# Patient Record
Sex: Male | Born: 1965 | Race: White | Hispanic: No | Smoking: Never smoker
Health system: Southern US, Community
[De-identification: ages and names within clinical notes are randomized; demographics above are authoritative.]

## PROBLEM LIST (undated history)

## (undated) DIAGNOSIS — G8929 Other chronic pain: Secondary | ICD-10-CM

## (undated) DIAGNOSIS — B1920 Unspecified viral hepatitis C without hepatic coma: Secondary | ICD-10-CM

## (undated) DIAGNOSIS — I5022 Chronic systolic (congestive) heart failure: Secondary | ICD-10-CM

## (undated) DIAGNOSIS — M542 Cervicalgia: Secondary | ICD-10-CM

## (undated) DIAGNOSIS — R569 Unspecified convulsions: Secondary | ICD-10-CM

## (undated) DIAGNOSIS — M549 Dorsalgia, unspecified: Secondary | ICD-10-CM

## (undated) DIAGNOSIS — K859 Acute pancreatitis without necrosis or infection, unspecified: Secondary | ICD-10-CM

## (undated) DIAGNOSIS — F1010 Alcohol abuse, uncomplicated: Secondary | ICD-10-CM

## (undated) DIAGNOSIS — I10 Essential (primary) hypertension: Secondary | ICD-10-CM

## (undated) DIAGNOSIS — I4891 Unspecified atrial fibrillation: Secondary | ICD-10-CM

## (undated) HISTORY — PX: ABDOMINAL SURGERY: SHX537

## (undated) HISTORY — PX: BACK SURGERY: SHX140

---

## 2003-12-13 ENCOUNTER — Emergency Department (HOSPITAL_COMMUNITY): Admission: EM | Admit: 2003-12-13 | Discharge: 2003-12-13 | Payer: Self-pay | Admitting: Emergency Medicine

## 2005-11-12 ENCOUNTER — Inpatient Hospital Stay (HOSPITAL_COMMUNITY): Admission: RE | Admit: 2005-11-12 | Discharge: 2005-11-13 | Payer: Self-pay | Admitting: Orthopaedic Surgery

## 2006-12-12 ENCOUNTER — Inpatient Hospital Stay (HOSPITAL_COMMUNITY): Admission: EM | Admit: 2006-12-12 | Discharge: 2006-12-16 | Payer: Self-pay | Admitting: *Deleted

## 2009-10-12 ENCOUNTER — Emergency Department (HOSPITAL_COMMUNITY): Admission: EM | Admit: 2009-10-12 | Discharge: 2009-10-12 | Payer: Self-pay | Admitting: Emergency Medicine

## 2010-03-03 ENCOUNTER — Emergency Department (HOSPITAL_COMMUNITY)
Admission: EM | Admit: 2010-03-03 | Discharge: 2010-03-03 | Payer: Self-pay | Source: Home / Self Care | Admitting: Emergency Medicine

## 2010-06-14 LAB — CBC
Hemoglobin: 13.2 g/dL (ref 13.0–17.0)
MCH: 32.2 pg (ref 26.0–34.0)
MCHC: 34.8 g/dL (ref 30.0–36.0)
Platelets: 266 10*3/uL (ref 150–400)
RBC: 4.1 MIL/uL — ABNORMAL LOW (ref 4.22–5.81)

## 2010-06-14 LAB — BASIC METABOLIC PANEL
Calcium: 9.3 mg/dL (ref 8.4–10.5)
Creatinine, Ser: 0.78 mg/dL (ref 0.4–1.5)
GFR calc Af Amer: >60 mL/min (ref >60)
GFR calc non Af Amer: >60 mL/min (ref >60)
Glucose, Bld: 114 mg/dL — ABNORMAL HIGH (ref 70–99)
Sodium: 135 mEq/L (ref 135–145)

## 2010-06-14 LAB — DIFFERENTIAL
Basophils Absolute: 0 10*3/uL (ref 0.0–0.1)
Basophils Relative: 0 % (ref 0–1)
Eosinophils Absolute: 0.2 10*3/uL (ref 0.0–0.7)
Monocytes Relative: 4 % (ref 3–12)
Neutro Abs: 6.1 10*3/uL (ref 1.7–7.7)
Neutrophils Relative %: 84 % — ABNORMAL HIGH (ref 43–77)

## 2010-08-12 NOTE — Group Therapy Note (Signed)
NAME:  Seth Newton, Seth Newton                 ACCOUNT NO.:  [id number]   MEDICAL RECORD NO.:  [id number]          PATIENT TYPE:  INP   LOCATION:  A327                          FACILITY:  [facility]   PHYSICIAN:  Osvaldo Shipper, MD     DATE OF BIRTH:  08-29-1965   DATE OF PROCEDURE:  12/14/2006  DATE OF DISCHARGE:                                 PROGRESS NOTE   Subjectively, the patient is feeling better.  He thinks his abdominal,  actually swelling, is better.  Inflammation looks better according to  the patient.  The pain is also improved.   OBJECTIVE FINDINGS:  He continues to be afebrile.  His other vital signs  are all stable.   LUNGS:  Clear to auscultation.  ABDOMINAL WALL EXAMINATION:  Reveals decreased erythema, decreased  swelling, decreased tenderness.  The area of induration also appears to  be reduced compared to last 2 days.   LABORATORY DATA:  His CBC continues to be stable.  White count is  normal.  Renal function is normal as well.   ASSESSMENT AND PLAN:  1. Abdominal wall cellulitis, improving.  Continue Vancomycin and      Levaquin for now.  Because there is no drainage from the site      cultures could not be sent actually.  Blood cultures were sent and      they have been negative.  So we will basically await the lesion to      improve further.  I think he needs at least 1-2 more days of IV      antibiotics and then he probably can be switched over just to some      p.o. Levaquin on discharge.   The patient has chronic back pain for which he is on OxyContin.  He is  requesting change to the schedule to what he was doing at home and we  have written an order for this as well.   Otherwise, he is tolerating p.o. quite well.  He is ambulating within  the room.  He is progressing well towards discharge.   Plan on discharge within the next couple of days.      Osvaldo Shipper, MD  Electronically Signed     GK/MEDQ  D:  12/14/2006  T:  12/14/2006  Job:  [id number]

## 2010-08-12 NOTE — H&P (Signed)
NAME:  Seth Newton, Seth Newton                 ACCOUNT NO.:  [id number]   MEDICAL RECORD NO.:  [id number]          PATIENT TYPE:  INP   LOCATION:  A327                          FACILITY:  [facility]   PHYSICIAN:  Marcello Moores, MD   DATE OF BIRTH:  [birth year]   DATE OF ADMISSION:  12/11/2006  DATE OF DISCHARGE:  LH                              HISTORY & PHYSICAL   PRIMARY CARE PHYSICIAN:  He follows with Dr. Para March for his blood  pressure.   CHIEF COMPLAINT:  Pain and swelling on his anterior lower abdomen of 2  weeks' duration.   HISTORY OF PRESENT ILLNESS:  Seth Newton is a 45 year old man with no  significant past medical history except that he has cervical spine  injury on pain medication and history of gastric surgery for bleeding  related to ulcer.  He came with pain and swelling of his lower abdomen  area.  He stated that he started to have this problem for the last 2  weeks and it was worsening other than resolving, and he decided to come  to the emergency room.  The patient stated that he did not remember any  insect bite or any other injuries.  He has low-grade fever, but he  denied any vomiting, no diarrhea, no abdominal complaints.  No urinary  complaints.  No pulmonary complaints.   REVIEW OF SYSTEMS:  10-point review of system is noncontributory for  this admission.   ALLERGIES:  HE IS ALLERGIC TO PENICILLIN.   SOCIAL HISTORY:  He denied smoking, denied drug abuse, and stated that  he is an occasional drinker.   FAMILY HISTORY:  Noncontributory.   PAST MEDICAL HISTORY:  1. Cervical spine area injury status post surgery.  2. History of peptic ulcer disease status post surgery for bleeding.   HOME MEDICATIONS:  1. Prilosec 20 mg daily.  2. Robaxin, unspecified dose.  3. Oxycodone 40 mg 3 times a day.  4. Hydrocodone/acetaminophen in 10/325 mg p.r.n.   PHYSICAL EXAMINATION:  GENERAL:  The patient is lying in the bed without  any distress.  VITAL SIGNS:  Temperature is 100.4,  blood pressure is 132/83, pulse is  94, respiratory rate is 20, and saturation is 98%.  HEENT:  Has pink conjunctivae.  Nonicteric sclera.  NECK:  Supple.  Has pain on passive movement.  CHEST:  He has good air entry bilateral.  CVS:  S1-S2 regular.  ABDOMEN:  Soft.  Normoactive bowel sounds.  There is a hyperemic,  indurated area about 5 x 6 cm on the lower abdominal wall below the  umbilicus which is tender and warm.  There is no discharge from wound.  EXTREMITIES:  He has no pedal edema.  CNS:  He is alert and well-oriented.   LABORATORY DATA:  White blood cells 11.3, hemoglobin is 13, hematocrit  is 40, platelet count 190.  Chemistry:  Sodium is 134, potassium 3.6,  chloride  98, bicarb 28, glucose 114, and BUN 8, creatinine 0.7, calcium  is 9.2.   CAT scan of the abdomen was done and showed a large area of  inflammatory  change and infiltration with skin thickening seen in the anterior  abdominal wall left of midline up to 8 cm in transverse diameter.  Otherwise, there were no intra-abdominal problems.   ASSESSMENT:  Abdominal wall cellulitis without abscess currently.  Will  admit the patient and put him on antibiotics.  Will put him on  intravenous fluid and will send blood culture.  Will reassess the area  for possible collection and forming of abscess in case he might need  incision and drainage.  Otherwise, the patient is stable and will  continue his home pain medications as it was.      Marcello Moores, MD  Electronically Signed     MT/MEDQ  D:  12/12/2006  T:  12/12/2006  Job:  [id number]

## 2010-08-12 NOTE — Discharge Summary (Signed)
NAME:  Seth Newton, Seth Newton                 ACCOUNT NO.:  [id number]   MEDICAL RECORD NO.:  [id number]          PATIENT TYPE:  INP   LOCATION:  A327                          FACILITY:  [facility]   PHYSICIAN:  Skeet Latch, DO    DATE OF BIRTH:  1965-08-17   DATE OF ADMISSION:  12/11/2006  DATE OF DISCHARGE:  LH                               DISCHARGE SUMMARY   DISCHARGE DIAGNOSES:  1. Abdominal wall cellulitis.  2. Chronic pain.   BRIEF HOSPITAL COURSE:  The patient is a 45 year old Caucasian male with  no significant past medical history other than chronic pain from a  cervical spine injury and a history of gastric surgery who presented  with pain and swelling in his lower abdomen for 2 weeks. The patient  stated that he had the problem for approximately 2 weeks and it began to  worsen and he decided to come to the emergency room for evaluation. The  patient denied any insect bites or injuries to that area but admitted to  having a low grade fever but no nausea, vomiting or diarrhea. Upon being  seen, the patient's  initial labs showed a white count of 11.3,  hemoglobin 13, hematocrit 40, platelet count of 190,000. Sodium 134,  potassium 3.6, chloride 98, bicarb 28, glucose 114, BUN 8 and a  creatinine of 0.7.   The patient presented with a large are of cellulitis in his lower  abdomen area. CT scan of his abdomen and pelvis was performed which  showed; the abdomen showed no acute upper abdominal abnormalities, his  pelvis showed subcutaneous inflammatory process with skin thickening,  anterior abdominal wall and pelvis left of midline compatible with  cellulitis and no acute intrapelvic processes. The patient was placed on  IV antibiotics and blood cultures were sent. Blood cultures so far have  been negative. The patient's  IV antibiotics were subsequently switched  to oral. The patient's  swelling and discomfort in his lower abdominal  area have improved. The patient is stable enough for  discharge at this  time.   His vitals on discharge, temperature is 98.6, pulse 67, respirations 20,  blood pressure 120/72. Sodium 142, potassium 4.0, chloride 104, CO2 is  29, glucose 104, BUN 7, creatinine 0.75. White count 7.6, hemoglobin  13.4, hematocrit 38.0, platelet count is 281.   DISCHARGE MEDICATIONS:  1. Prilosec 20 mg daily.  2. Continue his Robaxin 3 times a day.  3. His OxyContin 40 mg 3 times a day.  4. Hydrocodone/acetaminophen 10/325 as needed.  5. Cymbalta 60 mg daily.  6. Levaquin 750 mg orally daily x10 days.   CONDITION ON DISCHARGE:  Condition at discharge is stable.   DISPOSITION:  The patient will be discharged to home.   INSTRUCTIONS:  The patient is to return to work after seeing his primary  care physician in the next 5 to 7 days.   DIET:  The patient is to maintain a regular diet.   WOUND CARE:  The patient is to keep area clean with soap and water,  change the bandages 2 to 3  times a day.   ACTIVITY:  The patient is to increase his activity slowly. The patient  is instructed to follow up with his primary care physician in the next 5  to 7 days. Return to emergency room if any increase in abdominal pain,  swelling or discomfort.      Skeet Latch, DO  Electronically Signed     SM/MEDQ  D:  12/16/2006  T:  12/16/2006  Job:  [id number]

## 2010-08-15 NOTE — Op Note (Signed)
NAME:  Seth Newton, Seth Newton                 ACCOUNT NO.:  [id number]   MEDICAL RECORD NO.:  [id number]          PATIENT TYPE:  INP   LOCATION:  5039                         FACILITY:  [facility]   PHYSICIAN:  Sharolyn Douglas, M.D.        DATE OF BIRTH:  1965-08-29   DATE OF PROCEDURE:  11/12/2005  DATE OF DISCHARGE:                                 OPERATIVE REPORT   DIAGNOSIS:  Cervical spondylotic myeloradiculopathy.   PROCEDURE:  1. Anterior cervical diskectomy C4-5, C5-6 and C6-7 with wide      decompression of the spinal cord and nerve root bilaterally.  2. Anterior cervical arthrodesis C4-5, C5-6 and C6-7 with placement of      three allograft prosthesis spacers packed with local autogenous bone      graft.  3. Anterior cervical plating C4-C7 using the Abbott spine system.   SURGEON:  Sharolyn Douglas, MD   ASSISTANT:  Verlin Fester, PA   ANESTHESIA:  General endotracheal.   ESTIMATED BLOOD LOSS:  Minimal.   COMPLICATIONS:  None.   NEEDLE AND SPONGE COUNT:  Correct.   INDICATIONS:  The patient is a pleasant 45 year old male with chronic neck  and upper extremity pain.  He also complains of pain in his right leg.  He  has failed all attempts at conservative treatment modalities.  He has been  taking significant amounts of narcotics which were weaned down in  preparation for surgery.  His imaging studies demonstrate severe spondylosis  and degenerative changes at C4-5, C5-6 at C6-7 with varying degrees of  spinal stenosis and foraminal narrowing.  At this point he elects to undergo  ACDF C4-C7 in hopes of improving his symptoms.  Risk and benefits were  reviewed.  He elected to proceed.   PROCEDURE:  After proper identification in the holding area and informed  consent he was taken to the operating room.  He underwent general  endotracheal anesthesia without difficulty, was given prophylactic IV  antibiotics.  Neuro monitoring was established in the form of motor evoked  potentials and SSEPs.   There he was carefully positioned supine with his  neck in a neutral position.  5 pounds of halter traction applied.  Neck  prepped, draped usual sterile fashion.  The transverse incision was made  just above the cricoid cartilage on the left side.  Dissection was carried  sharply through platysma.  The interval between the SCM and strap muscles  medially was developed down to the prevertebral space.  The esophagus,  trachea, carotid sheath were identified and protected at all times.  There  osteophytes easily palpable at C4-5, C5-6 and C6-7.  Spinal needle was  placed at C5-6.  X-ray taken to confirm this level.  We then elevated the  longus coli muscle out over C4-5, C5-6 and C6-7 disk spaces bilaterally.  Shadow line retractor placed.  The microscope was draped and brought into  the field.  Leksell rongeur used to remove the overhanging osteophytes at C4-  5, C5-6 and C6-7.  We then placed Caspar distraction pins in the C4, C5, C6,  C7 vertebral bodies.  Starting at the 4-5 distraction was applied.  We then  completed the radical diskectomy back to the posterior longitudinal  ligament.  The disk was degenerative and the disk space narrowed.  High-  speed bur used to take down the uncovertebral joints and posterior vertebral  margins.  This bone was saved for later autografting.  2 mm micro Kerrison  punch used to take down the posterior longitudinal ligament, undercut the  posterior vertebral margins and complete wide foraminotomies.  We found that  there was right-sided foraminal stenosis greater than left-sided.  We then  placed a 6 mm allograft prosthesis spacer which was packed with the local  bone graft into the interspace.  Carefully countersunk 1 mm.  We then  performed a similar procedure at C5-6.  Again we found the disk to be  degenerative.  The uncovertebral joints were hypertrophied.  High-speed bur  used to take down the posterior vertebral margins and the uncovertebral   joints.  The micro Kerrison punch was used to take down the posterior  longitudinal ligament and again complete wide foraminotomies.  Again we  placed a 6 mL allograft prosthesis spacer which had been packed with the  local bone graft.  It was countersunk 1 mm.  We then turned our attention to  C6-7.  At this level there was even more degenerative changes and narrowing  of the disk space.  Disk space itself was quite mobile and we could achieve  good distraction.  The diskectomy was taken back to the posterior  longitudinal ligament.  High-speed bur was used take down vertebral margins  and uncovertebral joints.  The Kerrison punch used to complete the  decompression.  We found the subligamentous disk herniation on the right  side which was compressing the cord and extended into the right foramen.  This was all decompressed.  Once we were satisfied with decompression the  cartilaginous endplates were removed and the endplates were prepared for  fusion.  A 7 mm allograft prosthesis spacer was used this time packed with  the local bone graft countersunk 1 mm.  We then placed a 55-mm anterior  cervical plate from Z6-X0 with the 13 mm locking screws.  The screw purchase  was good.  We ensured that the locking mechanism engaged.  The esophagus,  trachea and carotid sheath were examined.  There were no apparent injuries.  The wound was irrigated.  Hemostasis was achieved.  A deep Penrose drain was  left in place.  The platysma was closed with interrupted 2-0 Vicryl suture.  Subcutaneous layer closed with interrupted 3-0 Vicryl suture.  The skin was  approximated with a running 4-0 subcuticular Vicryl suture.  Benzoin, Steri-  Strips placed.  Sterile dressing applied.  Aspen cervical collar placed.  The patient was extubated without difficulty, transferred to recovery stable  in condition able to move his upper and lower extremities.  It should be noted that my assistant [facility], PA was  present throughout the  procedure during the positioning, the exposure, decompression, the  instrumentation, and fusion and also she assisted with wound closure.      Sharolyn Douglas, M.D.  Electronically Signed     MC/MEDQ  D:  11/12/2005  T:  11/13/2005  Job:  [id number]

## 2010-08-15 NOTE — H&P (Signed)
NAME:  Seth Newton, Seth Newton                 ACCOUNT NO.:  [id number]   MEDICAL RECORD NO.:  [id number]          PATIENT TYPE:  INP   LOCATION:  5039                         FACILITY:  [facility]   PHYSICIAN:  Sharolyn Douglas, M.D.        DATE OF BIRTH:  [birth year]   DATE OF ADMISSION:  11/12/2005  DATE OF DISCHARGE:  11/13/2005                                HISTORY & PHYSICAL   Seth Newton COMPLAINT:  Neck and bilateral upper extremity pain, left greater than  right.   HISTORY OF PRESENT ILLNESS:  The patient is a 45 year old male who is having  increasing pain in his neck and his upper extremities.  He was found to have  significant degenerative problems as well as foraminal narrowing and spinal  stenosis at C4 to C7.  He has failed to improve with conservative treatment  and his pain is severe and limiting his activities.  The best course of  management was thought to be an ACDF procedure from C4 to C7.  This  procedure was discussed in detail with the patient by Dr. Noel Gerold, as well as  myself, and including risks and benefits and he opted to proceed.   ALLERGIES:  PENICILLIN causes hives.  MORPHINE causes itching.   MEDICATIONS:  Norco and Robaxin.   PAST MEDICAL HISTORY:  History of ulcers.   PAST SURGICAL HISTORY:  Abdominal surgery in 2006 secondary to his ulcers,  posterior cervical surgery and right arm surgery for an infection.   SOCIAL HISTORY:  The patient denies tobacco use, denies alcohol use.  He is  single, engaged, has 2 children, age 67 and 20.   FAMILY HISTORY:  Noncontributory.   REVIEW OF SYSTEMS:  Negative.   PHYSICAL EXAMINATION:  VITAL SIGNS:  Blood pressure is 110/70, respirations  16 and unlabored.  Pulse is 84 and regular.  GENERAL APPEARANCE:  The patient is a 45 year old white male who is alert  and oriented, in no acute distress.  He is well-nourished an well-groomed  and appears his stated age as well as cooperative to exam.  HEENT:  Head is normocephalic, atraumatic.   Pupils are equal, round and  reactive to light.  Extraocular movements intact.  Nares patent.  Pharynx is  clear.  NECK:  Soft to palpation.  No lymphadenopathy, thyromegaly or bruits  appreciated.  CHEST:  Clear to auscultation bilaterally.  No rales, rhonchi, stridor or  wheezes or friction rubs.  BREASTS:  Not pertinent and not performed.  HEART:  S1 and S2, regular rate and rhythm.  No murmurs, gallops or rubs  noted.  ABDOMEN:  Soft to palpation, nontender and non-distended.  No organomegaly  noted.  GU:  Not pertinent and not performed.  EXTREMITIES:  As per HPI.  SKIN:  Intact without any lesions or rashes.   IMPRESSION:  1. 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  2. Needle phobia.   PLAN:  Admit to [facility] on November 14, 2005 for an ACDF, C4 to  C7, done by Dr. Noel Gerold.  The patient's primary care doctor is Dr. Ree Kida  [facility] in [place], [place].      Verlin Fester, P.A.      Sharolyn Douglas, M.D.  Electronically Signed    CM/MEDQ  D:  11/18/2005  T:  11/18/2005  Job:  [id number]

## 2011-01-08 LAB — CBC
HCT: 36 — ABNORMAL LOW
HCT: 38 — ABNORMAL LOW
Hemoglobin: 12.1 — ABNORMAL LOW
Hemoglobin: 12.7 — ABNORMAL LOW
Hemoglobin: 13.4
MCHC: 34.7
MCHC: 34.7
MCV: 85.9
MCV: 86.1
Platelets: 207
RBC: 4.01 — ABNORMAL LOW
RBC: 4.15 — ABNORMAL LOW
RDW: 13.4
WBC: 6
WBC: 7.1

## 2011-01-08 LAB — COMPREHENSIVE METABOLIC PANEL
ALT: 14
AST: 12
Alkaline Phosphatase: 68
CO2: 28
Calcium: 8.7
Chloride: 100
GFR calc Af Amer: >60
GFR calc non Af Amer: >60
Glucose, Bld: 101 — ABNORMAL HIGH
Potassium: 4.2
Sodium: 133 — ABNORMAL LOW
Total Bilirubin: 0.9

## 2011-01-08 LAB — BASIC METABOLIC PANEL
BUN: 4 — ABNORMAL LOW
CO2: 29
Calcium: 9.1
Calcium: 9.3
Chloride: 104
Chloride: 106
Creatinine, Ser: 0.75
GFR calc Af Amer: >60
GFR calc Af Amer: >60
GFR calc non Af Amer: >60
GFR calc non Af Amer: >60
Glucose, Bld: 104 — ABNORMAL HIGH
Potassium: 4
Potassium: 4
Sodium: 140
Sodium: 142

## 2011-01-08 LAB — VANCOMYCIN, TROUGH: Vancomycin Tr: 9.2

## 2011-01-08 LAB — DIFFERENTIAL
Band Neutrophils: 0
Basophils Absolute: 0.1
Basophils Relative: 0
Basophils Relative: 0
Basophils Relative: 1
Blasts: 0
Eosinophils Absolute: 0.2
Eosinophils Absolute: 0.2
Eosinophils Relative: 3
Eosinophils Relative: 3
Eosinophils Relative: 4
Lymphocytes Relative: 18
Lymphocytes Relative: 18
Lymphs Abs: 0.9
Lymphs Abs: 1
Monocytes Absolute: 0.4
Monocytes Relative: 14 — ABNORMAL HIGH
Neutro Abs: 4.4
Neutrophils Relative %: 73
Neutrophils Relative %: 76

## 2011-01-09 LAB — CBC
MCHC: 33.8
MCV: 87.8
Platelets: 190
WBC: 11.3 — ABNORMAL HIGH

## 2011-01-09 LAB — CULTURE, BLOOD (ROUTINE X 2)

## 2011-01-09 LAB — BASIC METABOLIC PANEL
BUN: 8
CO2: 28
Chloride: 98
Creatinine, Ser: 0.78

## 2011-01-09 LAB — DIFFERENTIAL
Basophils Absolute: 0
Basophils Relative: 0
Eosinophils Absolute: 0.4
Neutrophils Relative %: 78 — ABNORMAL HIGH

## 2012-09-07 ENCOUNTER — Emergency Department (HOSPITAL_COMMUNITY): Payer: Medicare Other

## 2012-09-07 ENCOUNTER — Encounter (HOSPITAL_COMMUNITY): Payer: Self-pay | Admitting: *Deleted

## 2012-09-07 ENCOUNTER — Observation Stay (HOSPITAL_COMMUNITY)
Admission: EM | Admit: 2012-09-07 | Discharge: 2012-09-08 | Disposition: A | Discharge status: Home / Self Care | Payer: Medicare Other | Attending: Internal Medicine | Admitting: Internal Medicine

## 2012-09-07 DIAGNOSIS — R9431 Abnormal electrocardiogram [ECG] [EKG]: Principal | ICD-10-CM | POA: Insufficient documentation

## 2012-09-07 DIAGNOSIS — G40909 Epilepsy, unspecified, not intractable, without status epilepticus: Secondary | ICD-10-CM | POA: Insufficient documentation

## 2012-09-07 DIAGNOSIS — E876 Hypokalemia: Secondary | ICD-10-CM | POA: Insufficient documentation

## 2012-09-07 DIAGNOSIS — G8929 Other chronic pain: Secondary | ICD-10-CM | POA: Insufficient documentation

## 2012-09-07 DIAGNOSIS — R55 Syncope and collapse: Secondary | ICD-10-CM | POA: Insufficient documentation

## 2012-09-07 DIAGNOSIS — I10 Essential (primary) hypertension: Secondary | ICD-10-CM | POA: Insufficient documentation

## 2012-09-07 HISTORY — DX: Essential (primary) hypertension: I10

## 2012-09-07 HISTORY — DX: Unspecified convulsions: R56.9

## 2012-09-07 HISTORY — DX: Other chronic pain: G89.29

## 2012-09-07 HISTORY — DX: Cervicalgia: M54.2

## 2012-09-07 HISTORY — DX: Dorsalgia, unspecified: M54.9

## 2012-09-07 LAB — BASIC METABOLIC PANEL
BUN: 12 mg/dL (ref 6–23)
CO2: 33 mEq/L — ABNORMAL HIGH (ref 19–32)
Calcium: 9.2 mg/dL (ref 8.4–10.5)
Chloride: 100 mEq/L (ref 96–112)
Creatinine, Ser: 0.82 mg/dL (ref 0.50–1.35)
Glucose, Bld: 139 mg/dL — ABNORMAL HIGH (ref 70–99)

## 2012-09-07 LAB — CBC WITH DIFFERENTIAL/PLATELET
Basophils Absolute: 0 10*3/uL (ref 0.0–0.1)
Eosinophils Relative: 0 % (ref 0–5)
HCT: 39.9 % (ref 39.0–52.0)
Hemoglobin: 13.9 g/dL (ref 13.0–17.0)
Lymphocytes Relative: 12 % (ref 12–46)
Lymphs Abs: 0.8 10*3/uL (ref 0.7–4.0)
MCV: 90.1 fL (ref 78.0–100.0)
Monocytes Absolute: 0.3 10*3/uL (ref 0.1–1.0)
Monocytes Relative: 4 % (ref 3–12)
Neutro Abs: 5.7 10*3/uL (ref 1.7–7.7)
RDW: 12.9 % (ref 11.5–15.5)
WBC: 6.8 10*3/uL (ref 4.0–10.5)

## 2012-09-07 LAB — RAPID URINE DRUG SCREEN, HOSP PERFORMED
Amphetamines: NOT DETECTED
Barbiturates: NOT DETECTED
Benzodiazepines: NOT DETECTED
Tetrahydrocannabinol: NOT DETECTED

## 2012-09-07 LAB — TROPONIN I: Troponin I: <0.3 ng/mL (ref <0.30)

## 2012-09-07 LAB — ETHANOL: Alcohol, Ethyl (B): <11 mg/dL (ref 0–11)

## 2012-09-07 MED ORDER — LOSARTAN POTASSIUM 50 MG PO TABS
50.0000 mg | ORAL_TABLET | Freq: Every day | ORAL | Status: DC
  Administered 2012-09-07 – 2012-09-08 (×2): 50 mg via ORAL
  Filled 2012-09-07 (×2): qty 1

## 2012-09-07 MED ORDER — ACETAMINOPHEN 325 MG PO TABS: 650.0000 mg | ORAL_TABLET | Freq: Four times a day (QID) | ORAL | Status: DC | PRN

## 2012-09-07 MED ORDER — ASPIRIN EC 325 MG PO TBEC
325.0000 mg | DELAYED_RELEASE_TABLET | Freq: Every day | ORAL | Status: DC
  Administered 2012-09-07 – 2012-09-08 (×2): 325 mg via ORAL
  Filled 2012-09-07 (×2): qty 1

## 2012-09-07 MED ORDER — LEVETIRACETAM 500 MG PO TABS
500.0000 mg | ORAL_TABLET | Freq: Two times a day (BID) | ORAL | Status: DC
  Administered 2012-09-07 – 2012-09-08 (×2): 500 mg via ORAL
  Filled 2012-09-07 (×2): qty 1

## 2012-09-07 MED ORDER — SODIUM CHLORIDE 0.9 % IJ SOLN
3.0000 mL | Freq: Two times a day (BID) | INTRAMUSCULAR | Status: DC
  Administered 2012-09-07: 3 mL via INTRAVENOUS

## 2012-09-07 MED ORDER — ENOXAPARIN SODIUM 40 MG/0.4ML ~~LOC~~ SOLN
40.0000 mg | SUBCUTANEOUS | Status: DC
  Administered 2012-09-07: 40 mg via SUBCUTANEOUS
  Filled 2012-09-07: qty 0.4

## 2012-09-07 MED ORDER — ONDANSETRON HCL 4 MG PO TABS: 4.0000 mg | ORAL_TABLET | Freq: Four times a day (QID) | ORAL | Status: DC | PRN

## 2012-09-07 MED ORDER — ONDANSETRON HCL 4 MG/2ML IJ SOLN: 4.0000 mg | Freq: Four times a day (QID) | INTRAMUSCULAR | Status: DC | PRN

## 2012-09-07 MED ORDER — DIAZEPAM 5 MG PO TABS
10.0000 mg | ORAL_TABLET | Freq: Two times a day (BID) | ORAL | Status: DC
  Administered 2012-09-07 – 2012-09-08 (×2): 10 mg via ORAL
  Filled 2012-09-07 (×2): qty 2

## 2012-09-07 MED ORDER — MORPHINE SULFATE ER 100 MG PO TBCR
100.0000 mg | EXTENDED_RELEASE_TABLET | Freq: Two times a day (BID) | ORAL | Status: DC
  Administered 2012-09-07 – 2012-09-08 (×2): 100 mg via ORAL
  Filled 2012-09-07 (×2): qty 1

## 2012-09-07 MED ORDER — FENTANYL CITRATE 0.05 MG/ML IJ SOLN
50.0000 ug | INTRAMUSCULAR | Status: DC | PRN
  Administered 2012-09-07: 50 ug via INTRAVENOUS
  Filled 2012-09-07: qty 2

## 2012-09-07 MED ORDER — MIRTAZAPINE 30 MG PO TABS
30.0000 mg | ORAL_TABLET | Freq: Every day | ORAL | Status: DC
  Administered 2012-09-07: 30 mg via ORAL
  Filled 2012-09-07: qty 1

## 2012-09-07 MED ORDER — POTASSIUM CHLORIDE IN NACL 40-0.9 MEQ/L-% IV SOLN
INTRAVENOUS | Status: DC
  Administered 2012-09-07 – 2012-09-08 (×2): via INTRAVENOUS

## 2012-09-07 MED ORDER — SODIUM CHLORIDE 0.9 % IV SOLN: INTRAVENOUS | Status: DC

## 2012-09-07 MED ORDER — ACETAMINOPHEN 650 MG RE SUPP: 650.0000 mg | Freq: Four times a day (QID) | RECTAL | Status: DC | PRN

## 2012-09-07 MED ORDER — OXYCODONE HCL 5 MG PO TABS
30.0000 mg | ORAL_TABLET | Freq: Every day | ORAL | Status: DC
  Administered 2012-09-07 – 2012-09-08 (×4): 30 mg via ORAL
  Filled 2012-09-07 (×4): qty 6

## 2012-09-07 NOTE — H&P (Signed)
[facility] History and Physical  Seth Newton:[id number] DOB: [birth year] DOA: 09/07/2012  Referring physician: Dr. Clarene Duke, emergency room physician PCP: No primary provider on file.  Specialists:   Chief Complaint: Syncope  HPI: Seth Newton is a 47 y.o. male with no known prior cardiac history who presents to the emergency room after a syncopal episode. Patient reports that he was in his usual state of health today. He had gotten up to use the bathroom. He was passing urine when he started to feel somewhat lightheaded. He tried to go back to his chair to sit down and before he needed to his chair he passed out. He does not remember actually falling but woke up on the floor. He feels that he did bump his head. After he woke up, he was mildly disoriented but quickly returned to his baseline mental status. He has not had any focal weakness since then. Did not have any tongue biting. No jerking was reported. Denies any chest pain. He does admit to noticing some irregular heartbeats in the morning of admission. Denies any shortness of breath but did admit to having diaphoresis. He felt that he may have had a fever recently. Also has had some intermittent dysuria. Denies any recent vomiting or diarrhea. The patient has a history of seizures. Last seizure was a few weeks ago.  Review of Systems:   Past Medical History  Diagnosis Date  . Seizures   . Chronic back pain   . Chronic neck pain   . Hypertension    Past Surgical History  Procedure Laterality Date  . Back surgery    . Abdominal surgery     Social History:  reports that he has never smoked. He does not have any smokeless tobacco history on file. He reports that he does not drink alcohol or use illicit drugs.   Allergies  Allergen Reactions  . Gabapentin   . Penicillins Rash    Family history: No family history of premature coronary disease  Prior to Admission medications   Medication Sig Start Date End Date  Taking? Authorizing Provider  diazepam (VALIUM) 10 MG tablet Take 10 mg by mouth 2 (two) times daily.   Yes Historical Provider, MD  levETIRAcetam (KEPPRA) 500 MG tablet Take 500 mg by mouth 2 (two) times daily.   Yes Historical Provider, MD  losartan (COZAAR) 50 MG tablet Take 50 mg by mouth daily.   Yes Historical Provider, MD  mirtazapine (REMERON) 30 MG tablet Take 30 mg by mouth at bedtime.   Yes Historical Provider, MD  oxycodone (ROXICODONE) 30 MG immediate release tablet Take 30 mg by mouth 5 (five) times daily.   Yes Historical Provider, MD  oxymorphone (OPANA ER) 40 MG 12 hr tablet Take 40 mg by mouth every 12 (twelve) hours.   Yes Historical Provider, MD   Physical Exam: Filed Vitals:   09/07/12 1555 09/07/12 1556 09/07/12 1558 09/07/12 1805  BP: 141/87 139/82 139/96 132/85  Pulse: 83 79 106 79  Temp:      TempSrc:      Resp:    18  Height:      Weight:      SpO2:    100%     General:  No acute distress  Eyes: Pupils are equal round reactive to light  ENT: Mucous membranes are moist  Neck: Supple  Cardiovascular: S1, S2, regular rate and rhythm  Respiratory: Clear to auscultation bilaterally   Abdomen: Soft, mildly tender in the  periumbilical region, bowel sounds are active  Skin: No rashes  Musculoskeletal: No pedal edema bilaterally  Psychiatric: Normal affect, cooperative with exam  Neurologic: Grossly intact, nonfocal  Labs on Admission:  Basic Metabolic Panel:  Recent Labs Lab 09/07/12 1540  NA 141  K 3.3*  CL 100  CO2 33*  GLUCOSE 139*  BUN 12  CREATININE 0.82  CALCIUM 9.2   Liver Function Tests: No results found for this basename: AST, ALT, ALKPHOS, BILITOT, PROT, ALBUMIN,  in the last 168 hours No results found for this basename: LIPASE, AMYLASE,  in the last 168 hours No results found for this basename: AMMONIA,  in the last 168 hours CBC:  Recent Labs Lab 09/07/12 1540  WBC 6.8  NEUTROABS 5.7  HGB 13.9  HCT 39.9  MCV 90.1   PLT 234   Cardiac Enzymes:  Recent Labs Lab 09/07/12 1540  TROPONINI <0.30    BNP (last 3 results) No results found for this basename: PROBNP,  in the last 8760 hours CBG: No results found for this basename: GLUCAP,  in the last 168 hours  Radiological Exams on Admission: Dg Chest 1 View  09/07/2012   *RADIOLOGY REPORT*  Clinical Data: Weakness and dizziness.  CHEST - 1 VIEW  Comparison: One-view chest 08/12/2012  Findings: The heart size is normal.  The lungs are clear.  The right hemidiaphragm is elevated.  Slight rightward curvature is noted in the upper thoracic spine.  IMPRESSION: No acute cardiopulmonary disease or significant interval change.   Original Report Authenticated By: Marin Roberts, M.D.   Ct Head Wo Contrast  09/07/2012   *RADIOLOGY REPORT*  Clinical Data: Dizziness, fall.  Posterior head and neck pain.  CT HEAD WITHOUT CONTRAST  Technique:  Contiguous axial images were obtained from the base of the skull through the vertex without contrast.  Comparison: 09/03/2012.  Findings: No evidence of an acute infarct, acute hemorrhage, mass lesion, mass effect or hydrocephalus.  Visualized portions of the paranasal sinuses and mastoid air cells are clear.  No fracture.  IMPRESSION: Negative.   Original Report Authenticated By: Leanna Battles, M.D.    EKG: Independently reviewed. Diffuse T wave inversions in anterolateral and inferiorly leads  Assessment/Plan Active Problems:   Abnormal EKG   Chronic pain   Syncope   Seizure disorder   Hypokalemia   Essential hypertension, benign   1. Syncope. Possibly vasovagal related to micturition. Will check orthostatics. Give IV fluids. The patient was found to have an abnormal EKG. She does not have any known prior cardiac history and has not had any prior cardiac testing. We'll cycle cardiac markers. Repeat EKG in the morning and check 2-D echocardiogram. We will also request a cardiology consultation to see if any further  cardiac testing is needed. Check urinalysis. 2. Seizure disorder. Continue Keppra. Patient will benefit from an outpatient evaluation with a neurologist. 3. Hypokalemia. Replace with IV fluids 4. Hypertension. Continue outpatient management 5. Chronic pain. Continue outpatient regimen  Code Status: Full code Family Communication: Discussed with patient Disposition Plan: Probable discharge tomorrow if improved  Time spent:  Riverlyn Kizziah [facility] Pager [phone]  If 7PM-7AM, please contact night-coverage [email or web address] Password [email or web address] 09/07/2012, 8:20 PM

## 2012-09-07 NOTE — ED Notes (Signed)
Pt states he was standing to pee this morning at home and became very weak. States he went to [facility] and left AMA because they told him he had PCP in his system. States he does not take drugs and he wants somebody to find out what is wrong with him because he is scared he is going to die.

## 2012-09-07 NOTE — ED Provider Notes (Signed)
History     CSN: [id number]  Arrival date & time 09/07/12  1355   First MD Initiated Contact with Patient 09/07/12 1442      Chief Complaint  Patient presents with  . Seizures  . Fatigue     HPI Pt was seen at 1450.  Per pt, c/o gradual onset and worsening of persistent generalized fatigue and weakness for the past several weeks, worse this morning. Pt states he was performing his morning routine in the bathroom today when he suddenly felt "lightheaded." States he "felt like" he was going to pass out; unsure if he did have a brief LOC.  Has been associated with nausea. Pt endorses he has been taking his keppra regularly for his hx seizures, with LD this morning when he woke up. Pt states he does not believe he had a seizure this morning because he was not incont of bowel or bladder, per his usual seizure pattern. Denies CP/palpitations, no SOB/cough, no abd pain, no vomiting/diarrhea, no focal motor weakness, no tingling/numbness in extremities. Pt states he was evaluated at [facility] ED PTA, and left AMA "because they told me I had PCP in my system and I don't do drugs."      Past Medical History  Diagnosis Date  . Seizures   . Chronic back pain   . Chronic neck pain   . Hypertension     Past Surgical History  Procedure Laterality Date  . Back surgery    . Abdominal surgery       History  Substance Use Topics  . Smoking status: Never Smoker   . Smokeless tobacco: Not on file  . Alcohol Use: No      Review of Systems ROS: Statement: All systems negative except as marked or noted in the HPI; Constitutional: Negative for fever and chills. +generalized weakness/fatigue ; ; Eyes: Negative for eye pain, redness and discharge. ; ; ENMT: Negative for ear pain, hoarseness, nasal congestion, sinus pressure and sore throat. ; ; Cardiovascular: Negative for chest pain, palpitations, diaphoresis, dyspnea and peripheral edema. ; ; Respiratory: Negative for cough, wheezing and stridor.  ; ; Gastrointestinal: +nausea. Negative for vomiting, diarrhea, abdominal pain, blood in stool, hematemesis, jaundice and rectal bleeding. . ; ; Genitourinary: Negative for dysuria, flank pain and hematuria. ; ; Musculoskeletal: Negative for back pain and neck pain. Negative for swelling and trauma.; ; Skin: Negative for pruritus, rash, abrasions, blisters, bruising and skin lesion.; ; Neuro: +near syncope. Negative for headache and neck stiffness. Negative for altered level of consciousness , altered mental status, extremity weakness, paresthesias, involuntary movement.       Allergies  Gabapentin and Penicillins  Home Medications   Current Outpatient Rx  Name  Route  Sig  Dispense  Refill  . diazepam (VALIUM) 10 MG tablet   Oral   Take 10 mg by mouth 2 (two) times daily.         Marland Kitchen levETIRAcetam (KEPPRA) 500 MG tablet   Oral   Take 500 mg by mouth 2 (two) times daily.         Marland Kitchen losartan (COZAAR) 50 MG tablet   Oral   Take 50 mg by mouth daily.         . mirtazapine (REMERON) 30 MG tablet   Oral   Take 30 mg by mouth at bedtime.         Marland Kitchen oxycodone (ROXICODONE) 30 MG immediate release tablet   Oral   Take 30 mg by mouth  5 (five) times daily.         Marland Kitchen oxymorphone (OPANA ER) 40 MG 12 hr tablet   Oral   Take 40 mg by mouth every 12 (twelve) hours.           BP 139/96  Pulse 106  Temp(Src) 98.1 F (36.7 C) (Oral)  Resp 20  Ht 6\' 1"  (1.854 m)  Wt 190 lb (86.183 kg)  BMI 25.07 kg/m2  SpO2 100%  Physical Exam 1455: Physical examination:  Nursing notes reviewed; Vital signs and O2 SAT reviewed;  Constitutional: Well developed, Well nourished, Well hydrated, In no acute distress; Head:  Normocephalic, atraumatic; Eyes: EOMI, PERRL, No scleral icterus; ENMT: Mouth and pharynx normal, Mucous membranes moist; Neck: Supple, Full range of motion, No lymphadenopathy; Cardiovascular: Regular rate and rhythm, No gallop; Respiratory: Breath sounds clear & equal  bilaterally, No rales, rhonchi, wheezes.  Speaking full sentences with ease, Normal respiratory effort/excursion; Chest: Nontender, Movement normal; Abdomen: Soft, Nontender, Nondistended, Normal bowel sounds; Genitourinary: No CVA tenderness; Spine:  No midline CS, TS, LS tenderness.;; Extremities: Pulses normal, No tenderness, No edema, No calf edema or asymmetry.; Neuro: AA&Ox3, vague historian. Major CN grossly intact. No facial droop. Speech clear. No gross focal motor or sensory deficits in extremities.; Skin: Color normal, Warm, Dry.   ED Course  Procedures     MDM  MDM Reviewed: previous chart, nursing note and vitals Reviewed previous: labs, ECG, x-ray and CT scan Interpretation: labs, ECG, x-ray and CT scan      Date: 09/07/2012  Rate: 93  Rhythm: normal sinus rhythm  QRS Axis: normal  Intervals: normal  ST/T Wave abnormalities: nonspecific ST/T changes leads V3-V6, II, III, aVF  Conduction Disutrbances:none  Narrative Interpretation:   Old EKG Reviewed: changes noted; new STTW changes compared to previous EKG completed today at OSH at 12:09pm.  Results for orders placed during the hospital encounter of 09/07/12  CBC WITH DIFFERENTIAL      Result Value Range   WBC 6.8  4.0 - 10.5 K/uL   RBC 4.43  4.22 - 5.81 MIL/uL   Hemoglobin 13.9  13.0 - 17.0 g/dL   HCT 16.1  09.6 - 04.5 %   MCV 90.1  78.0 - 100.0 fL   MCH 31.4  26.0 - 34.0 pg   MCHC 34.8  30.0 - 36.0 g/dL   RDW 40.9  81.1 - 91.4 %   Platelets 234  150 - 400 K/uL   Neutrophils Relative % 83 (*) 43 - 77 %   Neutro Abs 5.7  1.7 - 7.7 K/uL   Lymphocytes Relative 12  12 - 46 %   Lymphs Abs 0.8  0.7 - 4.0 K/uL   Monocytes Relative 4  3 - 12 %   Monocytes Absolute 0.3  0.1 - 1.0 K/uL   Eosinophils Relative 0  0 - 5 %   Eosinophils Absolute 0.0  0.0 - 0.7 K/uL   Basophils Relative 0  0 - 1 %   Basophils Absolute 0.0  0.0 - 0.1 K/uL  BASIC METABOLIC PANEL      Result Value Range   Sodium 141  135 - 145 mEq/L    Potassium 3.3 (*) 3.5 - 5.1 mEq/L   Chloride 100  96 - 112 mEq/L   CO2 33 (*) 19 - 32 mEq/L   Glucose, Bld 139 (*) 70 - 99 mg/dL   BUN 12  6 - 23 mg/dL   Creatinine, Ser 7.82  0.50 - 1.35  mg/dL   Calcium 9.2  8.4 - 16.1 mg/dL   GFR calc non Af Amer >90  >90 mL/min   GFR calc Af Amer >90  >90 mL/min  ETHANOL      Result Value Range   Alcohol, Ethyl (B) <11  0 - 11 mg/dL  URINE RAPID DRUG SCREEN (HOSP PERFORMED)      Result Value Range   Opiates NONE DETECTED  NONE DETECTED   Cocaine NONE DETECTED  NONE DETECTED   Benzodiazepines NONE DETECTED  NONE DETECTED   Amphetamines NONE DETECTED  NONE DETECTED   Tetrahydrocannabinol NONE DETECTED  NONE DETECTED   Barbiturates NONE DETECTED  NONE DETECTED  TROPONIN I      Result Value Range   Troponin I <0.30  <0.30 ng/mL   Dg Chest 1 View 09/07/2012   *RADIOLOGY REPORT*  Clinical Data: Weakness and dizziness.  CHEST - 1 VIEW  Comparison: One-view chest 08/12/2012  Findings: The heart size is normal.  The lungs are clear.  The right hemidiaphragm is elevated.  Slight rightward curvature is noted in the upper thoracic spine.  IMPRESSION: No acute cardiopulmonary disease or significant interval change.   Original Report Authenticated By: Marin Roberts, M.D.   Ct Head Wo Contrast 09/07/2012   *RADIOLOGY REPORT*  Clinical Data: Dizziness, fall.  Posterior head and neck pain.  CT HEAD WITHOUT CONTRAST  Technique:  Contiguous axial images were obtained from the base of the skull through the vertex without contrast.  Comparison: 09/03/2012.  Findings: No evidence of an acute infarct, acute hemorrhage, mass lesion, mass effect or hydrocephalus.  Visualized portions of the paranasal sinuses and mastoid air cells are clear.  No fracture.  IMPRESSION: Negative.   Original Report Authenticated By: Leanna Battles, M.D.    [phone]:  [facility] ED records received from 6/7 and 6/11 ED visits. EKG compared and is different now. Will need observation admit.  Continues to deny specific CP or SOB. Neuro exam remains intact. T/C to [facility] Dr. Kerry Hough, case discussed, including:  HPI, pertinent PM/SHx, VS/PE, dx testing, ED course and treatment:  Agreeable to observation admit, requests to write temporary orders, obtain tele bed to team 2.          Laray Anger, DO 09/10/12 [phone]

## 2012-09-07 NOTE — ED Notes (Signed)
Pt states he had an "episode" this morning, states it could have been a seizure, states hx of seizures and takes Keppra. States he hit his head and is complaining of neck and shoulder pain. States EMS took him to [place] and EDP told pt that he has been doing PCP which pt denies. Pt left [facility] AMA.

## 2012-09-07 NOTE — ED Notes (Signed)
Visit from [place] being obtained.

## 2012-09-07 NOTE — ED Notes (Signed)
Pt complain of pain in left shoulder and neck. States he always has pain due to disk problems but the pain is worse today due to ? FALL. EDP aware

## 2012-09-08 ENCOUNTER — Encounter (HOSPITAL_COMMUNITY): Payer: Self-pay | Admitting: Cardiology

## 2012-09-08 DIAGNOSIS — G40909 Epilepsy, unspecified, not intractable, without status epilepticus: Secondary | ICD-10-CM

## 2012-09-08 DIAGNOSIS — R55 Syncope and collapse: Secondary | ICD-10-CM

## 2012-09-08 DIAGNOSIS — I517 Cardiomegaly: Secondary | ICD-10-CM

## 2012-09-08 LAB — URINALYSIS, ROUTINE W REFLEX MICROSCOPIC
Bilirubin Urine: NEGATIVE
Glucose, UA: NEGATIVE mg/dL
Leukocytes, UA: NEGATIVE
Nitrite: NEGATIVE
Specific Gravity, Urine: 1.025 (ref 1.005–1.030)
pH: 6 (ref 5.0–8.0)

## 2012-09-08 LAB — TROPONIN I: Troponin I: <0.3 ng/mL (ref <0.30)

## 2012-09-08 LAB — BASIC METABOLIC PANEL
Chloride: 100 mEq/L (ref 96–112)
GFR calc Af Amer: >90 mL/min (ref >90)
Potassium: 3.2 mEq/L — ABNORMAL LOW (ref 3.5–5.1)

## 2012-09-08 LAB — CBC
HCT: 37.2 % — ABNORMAL LOW (ref 39.0–52.0)
Hemoglobin: 13.1 g/dL (ref 13.0–17.0)
WBC: 6.2 10*3/uL (ref 4.0–10.5)

## 2012-09-08 NOTE — Progress Notes (Signed)
*  PRELIMINARY RESULTS* Echocardiogram 2D Echocardiogram has been performed.  Seth Newton 09/08/2012, 10:45 AM

## 2012-09-08 NOTE — Consult Note (Signed)
Primary cardiologist: Dr. Sherril Croon [facility]) Consulting cardiologist: Dr. Diona Browner  Clinical Summary Seth Newton is a 47 y.o.male with a reported history of seizures, to be establishing with Dr. Gerilyn Pilgrim soon, also hypertension on medical therapy. He states that he is in between primary care physicians but will be establishing with Dr. Sherril Croon in [place] next week. He is now admitted to the hospital after having an episode of syncope while standing and urinating. He states that he has had similar episodes over the years, although more frequently within the last few weeks. He describes symptoms very consistent with neurocardiogenic syncope.  Does report occasional palpitations, however no sudden onset or prolonged episodes, not associated with syncope. He gets an occasional "stabbing" left-sided chest pain without any clear precipitant, no obvious angina symptoms. He reports no personal history of CAD. His ECG at baseline is abnormal with fairly diffuse ST segment depression and T wave inversions, possibly repolarization abnormalities. He has not undergone any prior cardiovascular testing based on available information.  Main complaint is of chronic neck and back pain. He is on standing analgesics therapies. Most recent medication addition is Remeron. He is on Keppra for his seizures.   Allergies  Allergen Reactions  . Gabapentin   . Penicillins Rash    Medications Scheduled Medications: . sodium chloride   Intravenous STAT  . aspirin EC  325 mg Oral Daily  . diazepam  10 mg Oral BID  . enoxaparin (LOVENOX) injection  40 mg Subcutaneous Q24H  . levETIRAcetam  500 mg Oral BID  . losartan  50 mg Oral Daily  . mirtazapine  30 mg Oral QHS  . morphine  100 mg Oral Q12H  . oxycodone  30 mg Oral 5 X Daily  . sodium chloride  3 mL Intravenous Q12H     Infusions: . 0.9 % NaCl with KCl 40 mEq / L 100 mL/hr at 09/08/12 1610     PRN Medications:  acetaminophen, acetaminophen, ondansetron (ZOFRAN) IV,  ondansetron  Past Medical History  Diagnosis Date  . Seizures   . Chronic back pain   . Chronic neck pain   . Essential hypertension, benign     Past Surgical History  Procedure Laterality Date  . Back surgery    . Abdominal surgery      Family History  Problem Relation Age of Onset  . Hypertension Father     Social History Mr. Bellucci reports that he has never smoked. He does not have any smokeless tobacco history on file. Mr. Marciano reports that he does not drink alcohol.  Review of Systems Chronic knee pain and crepitus. Reports stable appetite. No orthopnea or PND. No obvious claudication. Otherwise negative.  Physical Examination Blood pressure 124/80, pulse 91, temperature 98.1 F (36.7 C), temperature source Oral, resp. rate 18, height 6\' 1"  (1.854 m), weight 182 lb 14.4 oz (82.963 kg), SpO2 97.00%.  Intake/Output Summary (Last 24 hours) at 09/08/12 1104 Last data filed at 09/08/12 0740  Gross per 24 hour  Intake    240 ml  Output    450 ml  Net   -210 ml   Comfortable at rest. HEENT: Conjunctiva and lids normal, oropharynx clear. Neck: Supple, no elevated JVP or carotid bruits, no thyromegaly. Lungs: Clear to auscultation, nonlabored breathing at rest. Thorax: Mild anterior chest soreness, no deformity Cardiac: Regular rate and rhythm, no S3 or significant systolic murmur, no pericardial rub. Abdomen: Soft, nontender, bowel sounds present, no guarding or rebound. Extremities: No pitting edema, distal pulses  2+. Skin: Warm and dry. Musculoskeletal: No kyphosis. Neuropsychiatric: Alert and oriented x3, affect grossly appropriate.  Lab Results Basic Metabolic Panel:  Recent Labs Lab 09/07/12 1540 09/08/12 0211  NA 141 138  K 3.3* 3.2*  CL 100 100  CO2 33* 31  GLUCOSE 139* 168*  BUN 12 12  CREATININE 0.82 0.83  CALCIUM 9.2 8.8   CBC:  Recent Labs Lab 09/07/12 1540 09/08/12 0211  WBC 6.8 6.2  NEUTROABS 5.7  --   HGB 13.9 13.1  HCT 39.9 37.2*    MCV 90.1 90.1  PLT 234 189   Cardiac Enzymes:  Recent Labs Lab 09/07/12 1540 09/07/12 2032 09/08/12 0211 09/08/12 0825  TROPONINI <0.30 <0.30 <0.30 <0.30    Imaging Head CT on 6/11 demonstrated no acute findings. Chest x-ray reports no significant interval change with normal heart size and clear lung fields. Right hemidiaphragm is elevated, old.  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 We discussed the basic physiology of this, also importance of maintaining adequate hydration status, also try to avoid frank syncope by taking a seated or supine position with the early onset of symptoms. Telemetry does not demonstrate any arrhythmias. He reports a long-standing history of intermittent episodes, although more frequent recently. Nose medication is Remeron.   2. Abnormal ECG at baseline, possibly repolarization abnormalities. Does have history of hypertension, no clear history of CAD or cardiomyopathy. He does not report any clear-cut angina, cardiac markers are normal. Echocardiogram is pending at this time.  3. Reported seizure disorder, on Keppra. To be establishing with Dr. Gerilyn Pilgrim. He states that these events are clearly different from his syncopal events.  4. Hypertension, on Cozaar.  5. Chronic pain on analgesics.   Recommendations  Have discussed with patient as outlined. Followup on echocardiogram today. If there are no significant structural abnormalities, we will most likely plan a followup stress test as an outpatient to investigate baseline ECG abnormalities, although doubt that this is necessarily contributing to his syncope. We did discuss the possibility of medical therapy for recurring neurocardiogenic syncope, sometimes low-dose beta blocker or even an SSRI can be effective. At this point would try conservative measures first, maintaining adequate hydration and avoiding events as possible by taking  a seated or supine position. He should also pay attention to consistent triggers. We will plan a followup office visit. Also encouraged him to establish with Dr. Sherril Croon and D. [place] as planned.  Jonelle Sidle, M.D., F.A.C.C.

## 2012-09-08 NOTE — Progress Notes (Signed)
Patient discharged home with instructions given on medications,and follow up visits, patient verbalized understanding.No c/o pain or discomfort noted.Vital signs stable.Accompanied by staff to an awaiting vehicle.

## 2012-09-08 NOTE — Discharge Summary (Signed)
Physician Discharge Summary  Seth Newton WUJ:[id number] DOB: 07-13-1965 DOA: 09/07/2012  PCP: No primary provider on file.  Admit date: 09/07/2012 Discharge date: 09/08/2012  Time spent: 40 minutes  Recommendations for Outpatient Follow-up:  1. Dr. Sherril Croon in 1 week. Evaluation of symptoms.  2. Dr. Gerilyn Pilgrim, call in 1-2 weeks   Discharge Diagnoses:  Active Problems:   Abnormal EKG   Chronic pain   Syncope   Seizure disorder   Hypokalemia   Essential hypertension, benign   Vasovagal syncope   Discharge Condition: stable  Diet recommendation: heart healthy  Filed Weights   09/07/12 1359 09/07/12 2022 09/08/12 0452  Weight: 86.183 kg (190 lb) 81.8 kg (180 lb 5.4 oz) 82.963 kg (182 lb 14.4 oz)    History of present illness:   Seth Newton is a 47 y.o. male with no known prior cardiac history who presented to the emergency room on 09/07/12 after a syncopal episode. Patient reported that he was in his usual state of health and he had gotten up to use the bathroom. He was passing urine when he started to feel somewhat lightheaded. He tried to go back to his chair to sit down and before he reached his chair he passed out. He did not remember actually falling but woke up on the floor. He felt that he did bump his head. After he woke up, he was mildly disoriented but quickly returned to his baseline mental status. He ha no focal weaknesses. Did not have any tongue biting. No jerking was reported. Denied any chest pain. He did admit to noticing some irregular heartbeats  morning of admission. Denied any shortness of breath but did admit to having diaphoresis. He felt that he may have had a fever recently. Also  had some intermittent dysuria. Denied any recent vomiting or diarrhea. The patient has a history of seizures. Last seizure was a few weeks ago.     Hospital Course:  Syncope. Admitted to telemetry. No arrythmia on tele. Patient not orthostatic. Given IV fluids. The patient was found  to have an abnormal EKG with ST and T wave abnormalities. No cardiac hx or previous testing. Troponin neg x3. Repeat EKG unchanged from previous.  2-D echocardiogram with EF 60% and grade 1 diastolic dysfunctin. 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 Discussed low dose BB but decided conservative measures appropriate at this time i.e maintaining hydration and avoiding events as much as possible by taking supine or seated position. Pt will follow up with cardiology. Office will call.   Abnormal ECG: possibly repolarization abnormality per cards. Hx HTN, no clear CAD or cardiomyopathy. Echo with EF 60% and grade 1 diastolic dysfunction. Cards to follow OP stress test.    Seizure disorder. Continue Keppra. Patient reports plans to see Dr. Irven Baltimore.    Hypokalemia. Replaced with IV fluids. Resolved at discharge   Hypertension. Controlled during this hospitalization.   Chronic pain. Stable at baseline during this hospitalization Continue outpatient regimen   Procedures:    Consultations:  cardiology  Discharge Exam: Filed Vitals:   09/07/12 2125 09/07/12 2128 09/08/12 0452 09/08/12 0926  BP: 142/96 149/100 121/86 124/80  Pulse:   71 91  Temp:   98.1 F (36.7 C)   TempSrc:   Oral   Resp:   18 18  Height:      Weight:   82.963 kg (182 lb 14.4 oz)   SpO2:   100% 97%    General: awake alert NAD Cardiovascular: RRR No  MGR No MGR No LE edema Respiratory: normal effort BS clear bilaterally to ausculation. No wheeze no rhonchi  Discharge Instructions     Medication List    TAKE these medications       diazepam 10 MG tablet  Commonly known as:  VALIUM  Take 10 mg by mouth 2 (two) times daily.     levETIRAcetam 500 MG tablet  Commonly known as:  KEPPRA  Take 500 mg by mouth 2 (two) times daily.     losartan 50 MG tablet  Commonly known as:  COZAAR  Take 50 mg by mouth daily.     mirtazapine 30 MG tablet  Commonly known as:  REMERON  Take 30 mg by  mouth at bedtime.     oxycodone 30 MG immediate release tablet  Commonly known as:  ROXICODONE  Take 30 mg by mouth 5 (five) times daily.     oxymorphone 40 MG 12 hr tablet  Commonly known as:  OPANA ER  Take 40 mg by mouth every 12 (twelve) hours.       Allergies  Allergen Reactions  . Gabapentin   . Penicillins Rash       Follow-up Information   Follow up with VYAS,DHRUV B., MD. Schedule an appointment as soon as possible for a visit in 1 week. (evaluation of symptoms. )    Contact information:   [address] [place] [address] [phone]       Follow up with [facility], KOFI, MD. Call in 1 week. (referred for follow up)    Contact information:   [address] [place] [address] [phone]        The results of significant diagnostics from this hospitalization (including imaging, microbiology, ancillary and laboratory) are listed below for reference.    Significant Diagnostic Studies: Dg Chest 1 View  09/07/2012   *RADIOLOGY REPORT*  Clinical Data: Weakness and dizziness.  CHEST - 1 VIEW  Comparison: One-view chest 08/12/2012  Findings: The heart size is normal.  The lungs are clear.  The right hemidiaphragm is elevated.  Slight rightward curvature is noted in the upper thoracic spine.  IMPRESSION: No acute cardiopulmonary disease or significant interval change.   Original Report Authenticated By: Marin Roberts, M.D.   Ct Head Wo Contrast  09/07/2012   *RADIOLOGY REPORT*  Clinical Data: Dizziness, fall.  Posterior head and neck pain.  CT HEAD WITHOUT CONTRAST  Technique:  Contiguous axial images were obtained from the base of the skull through the vertex without contrast.  Comparison: 09/03/2012.  Findings: No evidence of an acute infarct, acute hemorrhage, mass lesion, mass effect or hydrocephalus.  Visualized portions of the paranasal sinuses and mastoid air cells are clear.  No fracture.  IMPRESSION: Negative.   Original Report Authenticated By:  Leanna Battles, M.D.        Labs: Basic Metabolic Panel:  Recent Labs Lab 09/07/12 1540 09/08/12 0211  NA 141 138  K 3.3* 3.2*  CL 100 100  CO2 33* 31  GLUCOSE 139* 168*  BUN 12 12  CREATININE 0.82 0.83  CALCIUM 9.2 8.8       CBC:  Recent Labs Lab 09/07/12 1540 09/08/12 0211  WBC 6.8 6.2  NEUTROABS 5.7  --   HGB 13.9 13.1  HCT 39.9 37.2*  MCV 90.1 90.1  PLT 234 189   Cardiac Enzymes:  Recent Labs Lab 09/07/12 1540 09/07/12 2032 09/08/12 0211 09/08/12 0825  TROPONINI <0.30 <0.30 <0.30 <0.30  SignedGwenyth Bender  [facility] 09/08/2012, 12:44 PM Attending:  Patient seen and examined. Above note reviewed. The patient is medically stable for discharge. He will need followup with Dr. Gerilyn Pilgrim, neurology and also establish with a primary care physician. He may need cardiology followup also in the future. His blood glucose was elevated and this will need to be further investigated as an outpatient.

## 2012-09-08 NOTE — Care Management Note (Unsigned)
    Page 1 of 1   09/08/2012     11:39:38 AM   CARE MANAGEMENT NOTE 09/08/2012  Patient:  Seth Newton, Seth Newton   Account Number:  [id number]  Date Initiated:  09/08/2012  Documentation initiated by:  Rosemary Holms  Subjective/Objective Assessment:   Pt from home. Baseline independent, has walker. Does not have HH and he does not anticipate HH.     Action/Plan:   Anticipated DC Date:  09/08/2012   Anticipated DC Plan:  HOME/SELF CARE      DC Planning Services  CM consult      Choice offered to / List presented to:             Status of service:  In process, will continue to follow Medicare Important Message given?   (If response is "NO", the following Medicare IM given date fields will be blank) Date Medicare IM given:   Date Additional Medicare IM given:    Discharge Disposition:    Per UR Regulation:    If discussed at Long Length of Stay Meetings, dates discussed:    Comments:  09/08/12 Rosemary Holms RN BSN CM

## 2012-09-09 ENCOUNTER — Other Ambulatory Visit: Payer: Self-pay | Admitting: *Deleted

## 2012-09-09 DIAGNOSIS — R55 Syncope and collapse: Secondary | ICD-10-CM

## 2012-09-09 DIAGNOSIS — R9431 Abnormal electrocardiogram [ECG] [EKG]: Secondary | ICD-10-CM

## 2012-09-12 ENCOUNTER — Emergency Department (HOSPITAL_COMMUNITY)
Admission: EM | Admit: 2012-09-12 | Discharge: 2012-09-12 | Disposition: A | Discharge status: Home / Self Care | Payer: Medicare Other | Attending: Emergency Medicine | Admitting: Emergency Medicine

## 2012-09-12 ENCOUNTER — Encounter (HOSPITAL_COMMUNITY): Payer: Self-pay | Admitting: *Deleted

## 2012-09-12 ENCOUNTER — Emergency Department (HOSPITAL_COMMUNITY): Payer: Medicare Other

## 2012-09-12 DIAGNOSIS — Y9289 Other specified places as the place of occurrence of the external cause: Secondary | ICD-10-CM | POA: Insufficient documentation

## 2012-09-12 DIAGNOSIS — S0083XA Contusion of other part of head, initial encounter: Secondary | ICD-10-CM

## 2012-09-12 DIAGNOSIS — Y9301 Activity, walking, marching and hiking: Secondary | ICD-10-CM | POA: Insufficient documentation

## 2012-09-12 DIAGNOSIS — G8929 Other chronic pain: Secondary | ICD-10-CM | POA: Insufficient documentation

## 2012-09-12 DIAGNOSIS — I10 Essential (primary) hypertension: Secondary | ICD-10-CM | POA: Insufficient documentation

## 2012-09-12 DIAGNOSIS — S161XXA Strain of muscle, fascia and tendon at neck level, initial encounter: Secondary | ICD-10-CM

## 2012-09-12 DIAGNOSIS — G40909 Epilepsy, unspecified, not intractable, without status epilepticus: Secondary | ICD-10-CM | POA: Insufficient documentation

## 2012-09-12 DIAGNOSIS — Z79899 Other long term (current) drug therapy: Secondary | ICD-10-CM | POA: Insufficient documentation

## 2012-09-12 DIAGNOSIS — S1093XA Contusion of unspecified part of neck, initial encounter: Secondary | ICD-10-CM | POA: Insufficient documentation

## 2012-09-12 DIAGNOSIS — W19XXXA Unspecified fall, initial encounter: Secondary | ICD-10-CM

## 2012-09-12 DIAGNOSIS — R404 Transient alteration of awareness: Secondary | ICD-10-CM | POA: Insufficient documentation

## 2012-09-12 DIAGNOSIS — R296 Repeated falls: Secondary | ICD-10-CM | POA: Insufficient documentation

## 2012-09-12 DIAGNOSIS — R5381 Other malaise: Secondary | ICD-10-CM | POA: Insufficient documentation

## 2012-09-12 DIAGNOSIS — M549 Dorsalgia, unspecified: Secondary | ICD-10-CM | POA: Insufficient documentation

## 2012-09-12 DIAGNOSIS — M542 Cervicalgia: Secondary | ICD-10-CM | POA: Insufficient documentation

## 2012-09-12 DIAGNOSIS — Z880 Allergy status to penicillin: Secondary | ICD-10-CM | POA: Insufficient documentation

## 2012-09-12 DIAGNOSIS — S139XXA Sprain of joints and ligaments of unspecified parts of neck, initial encounter: Secondary | ICD-10-CM | POA: Insufficient documentation

## 2012-09-12 DIAGNOSIS — S0003XA Contusion of scalp, initial encounter: Secondary | ICD-10-CM | POA: Insufficient documentation

## 2012-09-12 LAB — CBC WITH DIFFERENTIAL/PLATELET
Basophils Relative: 0 % (ref 0–1)
Eosinophils Absolute: 0.1 10*3/uL (ref 0.0–0.7)
Eosinophils Relative: 1 % (ref 0–5)
Lymphs Abs: 0.9 10*3/uL (ref 0.7–4.0)
MCH: 31.7 pg (ref 26.0–34.0)
MCHC: 36 g/dL (ref 30.0–36.0)
MCV: 88.2 fL (ref 78.0–100.0)
Monocytes Relative: 8 % (ref 3–12)
Neutrophils Relative %: 76 % (ref 43–77)
Platelets: 225 10*3/uL (ref 150–400)

## 2012-09-12 LAB — GLUCOSE, CAPILLARY: Glucose-Capillary: 105 mg/dL — ABNORMAL HIGH (ref 70–99)

## 2012-09-12 LAB — BASIC METABOLIC PANEL
BUN: 10 mg/dL (ref 6–23)
CO2: 26 mEq/L (ref 19–32)
Calcium: 9.8 mg/dL (ref 8.4–10.5)
GFR calc non Af Amer: 76 mL/min — ABNORMAL LOW (ref >90)
Glucose, Bld: 114 mg/dL — ABNORMAL HIGH (ref 70–99)

## 2012-09-12 LAB — TROPONIN I: Troponin I: <0.3 ng/mL (ref <0.30)

## 2012-09-12 MED ORDER — ACETAMINOPHEN 500 MG PO TABS
ORAL_TABLET | ORAL | Status: AC
  Administered 2012-09-12: 1000 mg via ORAL
  Filled 2012-09-12: qty 2

## 2012-09-12 MED ORDER — ACETAMINOPHEN 500 MG PO TABS: 1000.0000 mg | ORAL_TABLET | Freq: Once | ORAL | Status: AC

## 2012-09-12 MED ORDER — CYCLOBENZAPRINE HCL 10 MG PO TABS: 10.0000 mg | ORAL_TABLET | Freq: Two times a day (BID) | ORAL | Status: DC | PRN

## 2012-09-12 MED ORDER — HYDROMORPHONE HCL PF 1 MG/ML IJ SOLN
1.0000 mg | Freq: Once | INTRAMUSCULAR | Status: AC
  Administered 2012-09-12: 1 mg via INTRAMUSCULAR
  Filled 2012-09-12: qty 1

## 2012-09-12 MED ORDER — NAPROXEN 500 MG PO TABS: 500.0000 mg | ORAL_TABLET | Freq: Two times a day (BID) | ORAL | Status: DC

## 2012-09-12 NOTE — ED Notes (Signed)
Pt states he is very weak. States he last remembered being in the restroom. Admitted last week here for seizures. Old bruising from last week to left face. Area of dried blood to left forehead.

## 2012-09-12 NOTE — ED Provider Notes (Signed)
History     This chart was scribed for Donnetta Hutching, MD, MD by Smitty Pluck, ED Scribe. The patient was seen in room APA05/APA05 and the patient's care was started at 9:18 AM.   CSN: [id number]  Arrival date & time 09/12/12  0853   Chief Complaint  Patient presents with  . Loss of Consciousness  . Fall    The history is provided by the patient and medical records. No language interpreter was used.   HPI Comments: Seth Newton is a 47 y.o. male with hx of seizures (diagnosed within past year) who presents to the Emergency Department wearing c-collar complaining of fall today. Pt reports that he is "unsure what happened" but he states he walked into bathroom to brush his teeth and turned around then suddenly felt weak. Pt takes Keppra for seizures. Wife reports that pt has been having seizures lately. She states he has episodes of shaking and being unresponsive and he is lethargic afterward. He states he has left lateral neck pain and HA. Pt denies hip pain, back pain, fever, chills, nausea, vomiting, diarrhea, weakness, cough, SOB and any other pain.  PCP is Dr. Sherril Croon   Past Medical History  Diagnosis Date  . Seizures   . Chronic back pain   . Chronic neck pain   . Essential hypertension, benign     Past Surgical History  Procedure Laterality Date  . Back surgery    . Abdominal surgery      Family History  Problem Relation Age of Onset  . Hypertension Father     History  Substance Use Topics  . Smoking status: Never Smoker   . Smokeless tobacco: Not on file  . Alcohol Use: No      Review of Systems 10 Systems reviewed and all are negative for acute change except as noted in the HPI.   Allergies  Gabapentin and Penicillins  Home Medications   Current Outpatient Rx  Name  Route  Sig  Dispense  Refill  . diazepam (VALIUM) 10 MG tablet   Oral   Take 10 mg by mouth 2 (two) times daily.         Marland Kitchen levETIRAcetam (KEPPRA) 500 MG tablet   Oral   Take 500 mg by  mouth 2 (two) times daily.         Marland Kitchen losartan (COZAAR) 50 MG tablet   Oral   Take 50 mg by mouth daily.         . mirtazapine (REMERON) 30 MG tablet   Oral   Take 30 mg by mouth at bedtime.         Marland Kitchen oxycodone (ROXICODONE) 30 MG immediate release tablet   Oral   Take 30 mg by mouth 5 (five) times daily.         Marland Kitchen oxymorphone (OPANA ER) 40 MG 12 hr tablet   Oral   Take 40 mg by mouth every 12 (twelve) hours.           BP 129/97  Pulse 94  Temp(Src) 98.5 F (36.9 C) (Oral)  Resp 18  SpO2 100%  Physical Exam  Nursing note and vitals reviewed. Constitutional: He is oriented to person, place, and time. He appears well-developed and well-nourished.  HENT:  Head: Normocephalic.  Tender over left cheek.   Eyes: Conjunctivae and EOM are normal. Pupils are equal, round, and reactive to light.  Neck: Normal range of motion. Neck supple.  Tenderness to left lateral neck radiating to  medial aspect of left shoulder   Cardiovascular: Normal rate, regular rhythm and normal heart sounds.   Pulmonary/Chest: Effort normal and breath sounds normal.  Abdominal: Soft. Bowel sounds are normal.  Musculoskeletal: Normal range of motion.  Neurological: He is alert and oriented to person, place, and time.  Skin: Skin is warm and dry.  Psychiatric: He has a normal mood and affect.    ED Course  Procedures (including critical care time) DIAGNOSTIC STUDIES: Oxygen Saturation is 100% on room air, normal by my interpretation.    COORDINATION OF CARE: 9:23 AM Discussed ED treatment with pt and pt agrees to CT head, CT maxillofacial and CT c-spine.  Results for orders placed during the hospital encounter of 09/12/12  GLUCOSE, CAPILLARY      Result Value Range   Glucose-Capillary 105 (*) 70 - 99 mg/dL   Comment 1 Documented in Chart     Comment 2 Notify RN    BASIC METABOLIC PANEL      Result Value Range   Sodium 140  135 - 145 mEq/L   Potassium 3.5  3.5 - 5.1 mEq/L   Chloride  104  96 - 112 mEq/L   CO2 26  19 - 32 mEq/L   Glucose, Bld 114 (*) 70 - 99 mg/dL   BUN 10  6 - 23 mg/dL   Creatinine, Ser 1.61  0.50 - 1.35 mg/dL   Calcium 9.8  8.4 - 09.6 mg/dL   GFR calc non Af Amer 76 (*) >90 mL/min   GFR calc Af Amer 88 (*) >90 mL/min  CBC WITH DIFFERENTIAL      Result Value Range   WBC 6.3  4.0 - 10.5 K/uL   RBC 4.73  4.22 - 5.81 MIL/uL   Hemoglobin 15.0  13.0 - 17.0 g/dL   HCT 04.5  40.9 - 81.1 %   MCV 88.2  78.0 - 100.0 fL   MCH 31.7  26.0 - 34.0 pg   MCHC 36.0  30.0 - 36.0 g/dL   RDW 91.4  78.2 - 95.6 %   Platelets 225  150 - 400 K/uL   Neutrophils Relative % 76  43 - 77 %   Neutro Abs 4.8  1.7 - 7.7 K/uL   Lymphocytes Relative 15  12 - 46 %   Lymphs Abs 0.9  0.7 - 4.0 K/uL   Monocytes Relative 8  3 - 12 %   Monocytes Absolute 0.5  0.1 - 1.0 K/uL   Eosinophils Relative 1  0 - 5 %   Eosinophils Absolute 0.1  0.0 - 0.7 K/uL   Basophils Relative 0  0 - 1 %   Basophils Absolute 0.0  0.0 - 0.1 K/uL         Ct Head Wo Contrast  09/12/2012   *RADIOLOGY REPORT*  Clinical Data:  Seizure, loss of consciousness, neck pain, fall, bruises  CT HEAD WITHOUT CONTRAST CT MAXILLOFACIAL WITHOUT CONTRAST CT CERVICAL SPINE WITHOUT CONTRAST  Technique:  Multidetector CT imaging of the head, cervical spine, and maxillofacial structures were performed using the standard protocol without intravenous contrast. Multiplanar CT image reconstructions of the cervical spine and maxillofacial structures were also generated.  Comparison:   Head CT 09/07/2012  CT HEAD  Findings: No intracranial hemorrhage.  No parenchymal contusion. No midline shift or mass effect.  Basilar cisterns are patent. No skull base fracture.  No fluid in the paranasal sinuses or mastoid air cells.  IMPRESSION: No intracranial trauma  CT MAXILLOFACIAL  Findings:  No evidence of fracture of the zygomatic arches. Orbital walls are intact.  Intraconal contents are normal.  Globes are normal.  There is no fluid in the  paranasal sinuses.  No evidence of fracture of the maxilla or pterygoid plates.  The mandibles are located.  No evidence of mandibular fracture.  No abnormality of the deep soft tissues of the face.  IMPRESSION: No evidence of facial fracture  CT CERVICAL SPINE  Findings:   No prevertebral soft tissue swelling.  Normal alignment of cervical vertebral bodies.  No loss of vertebral body height. Normal facet articulation.  Normal craniocervical junction.  No evidence epidural or paraspinal hematoma.  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   Original Report Authenticated By: Genevive Bi, M.D.   Ct Cervical Spine Wo Contrast  09/12/2012   *RADIOLOGY REPORT*  Clinical Data:  Seizure, loss of consciousness, neck pain, fall, bruises  CT HEAD WITHOUT CONTRAST CT MAXILLOFACIAL WITHOUT CONTRAST CT CERVICAL SPINE WITHOUT CONTRAST  Technique:  Multidetector CT imaging of the head, cervical spine, and maxillofacial structures were performed using the standard protocol without intravenous contrast. Multiplanar CT image reconstructions of the cervical spine and maxillofacial structures were also generated.  Comparison:   Head CT 09/07/2012  CT HEAD  Findings: No intracranial hemorrhage.  No parenchymal contusion. No midline shift or mass effect.  Basilar cisterns are patent. No skull base fracture.  No fluid in the paranasal sinuses or mastoid air cells.  IMPRESSION: No intracranial trauma  CT MAXILLOFACIAL  Findings:  No evidence of fracture of the zygomatic arches. Orbital walls are intact.  Intraconal contents are normal.  Globes are normal.  There is no fluid in the paranasal sinuses.  No evidence of fracture of the maxilla or pterygoid plates.  The mandibles are located.  No evidence of mandibular fracture.  No abnormality of the deep soft tissues of the face.  IMPRESSION: No evidence of facial fracture  CT CERVICAL SPINE  Findings:   No  prevertebral soft tissue swelling.  Normal alignment of cervical vertebral bodies.  No loss of vertebral body height. Normal facet articulation.  Normal craniocervical junction.  No evidence epidural or paraspinal hematoma.  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   Original Report Authenticated By: Genevive Bi, M.D.   Ct Maxillofacial Wo Cm  09/12/2012   *RADIOLOGY REPORT*  Clinical Data:  Seizure, loss of consciousness, neck pain, fall, bruises  CT HEAD WITHOUT CONTRAST CT MAXILLOFACIAL WITHOUT CONTRAST CT CERVICAL SPINE WITHOUT CONTRAST  Technique:  Multidetector CT imaging of the head, cervical spine, and maxillofacial structures were performed using the standard protocol without intravenous contrast. Multiplanar CT image reconstructions of the cervical spine and maxillofacial structures were also generated.  Comparison:   Head CT 09/07/2012  CT HEAD  Findings: No intracranial hemorrhage.  No parenchymal contusion. No midline shift or mass effect.  Basilar cisterns are patent. No skull base fracture.  No fluid in the paranasal sinuses or mastoid air cells.  IMPRESSION: No intracranial trauma  CT MAXILLOFACIAL  Findings:  No evidence of fracture of the zygomatic arches. Orbital walls are intact.  Intraconal contents are normal.  Globes are normal.  There is no fluid in the paranasal sinuses.  No evidence of fracture of the maxilla or pterygoid plates.  The mandibles are located.  No evidence of mandibular fracture.  No abnormality of the  deep soft tissues of the face.  IMPRESSION: No evidence of facial fracture  CT CERVICAL SPINE  Findings:   No prevertebral soft tissue swelling.  Normal alignment of cervical vertebral bodies.  No loss of vertebral body height. Normal facet articulation.  Normal craniocervical junction.  No evidence epidural or paraspinal hematoma.  Anterior cervical fusion again noted with interbody spacers  and bony fusion  IMPRESSION: No acute findings cervical spine.   Original Report Authenticated By: Genevive Bi, M.D.     No diagnosis found.   Date: 09/12/2012  Rate: 95  Rhythm: normal sinus rhythm  QRS Axis: normal  Intervals: normal  ST/T Wave abnormalities: T wave inversion inferiorly and laterally  Conduction Disutrbances: none  Narrative Interpretation: unremarkable     MDM  CT scan of head, face, cervical spine are negative. EKG and hemoglobin normal. No neurological deficits. Discharge meds Flexeril 10 mg #20 and Naprosyn 500 mg #20      I personally performed the services described in this documentation, which was scribed in my presence. The recorded information has been reviewed and is accurate.    Donnetta Hutching, MD 09/12/12 1330

## 2012-09-12 NOTE — ED Notes (Signed)
c-collar removed per RN

## 2012-09-15 ENCOUNTER — Encounter (HOSPITAL_COMMUNITY)
Admission: RE | Admit: 2012-09-15 | Discharge: 2012-09-15 | Disposition: A | Discharge status: Home / Self Care | Payer: Medicare Other | Source: Ambulatory Visit | Attending: Cardiology | Admitting: Cardiology

## 2012-09-15 ENCOUNTER — Encounter (HOSPITAL_COMMUNITY): Payer: Self-pay

## 2012-09-15 DIAGNOSIS — R9431 Abnormal electrocardiogram [ECG] [EKG]: Secondary | ICD-10-CM

## 2012-09-15 DIAGNOSIS — R55 Syncope and collapse: Secondary | ICD-10-CM

## 2012-09-15 MED ORDER — TECHNETIUM TC 99M SESTAMIBI - CARDIOLITE
30.0000 | Freq: Once | INTRAVENOUS | Status: AC | PRN
  Administered 2012-09-15: 30 via INTRAVENOUS

## 2012-09-15 MED ORDER — TECHNETIUM TC 99M SESTAMIBI - CARDIOLITE
10.0000 | Freq: Once | INTRAVENOUS | Status: AC | PRN
  Administered 2012-09-15: 10 via INTRAVENOUS

## 2012-09-15 MED ORDER — SODIUM CHLORIDE 0.9 % IJ SOLN
INTRAMUSCULAR | Status: AC
  Administered 2012-09-15: 10 mL via INTRAVENOUS
  Filled 2012-09-15: qty 10

## 2012-09-15 MED ORDER — REGADENOSON 0.4 MG/5ML IV SOLN
INTRAVENOUS | Status: AC
  Administered 2012-09-15: 0.4 mg via INTRAVENOUS
  Filled 2012-09-15: qty 5

## 2012-09-15 NOTE — Progress Notes (Signed)
Stress Lab Nurses Notes - Seth Newton 09/15/2012 Reason for doing test: Chest Pain Type of test: Marlane Hatcher Nurse performing test: Parke Poisson, RN Nuclear Medicine Tech: Lyndel Pleasure Echo Tech: Not Applicable MD performing test: R. Rothbart Family MD: NPCP Test explained and consent signed: yes IV started: 22g jelco, Saline lock flushed, No redness or edema and Saline lock started in radiology Symptoms:  Chest pressure & " Felt heart beating faster" Treatment/Intervention: None Reason test stopped: protocol completed After recovery IV was: Discontinued via X-ray tech and No redness or edema Patient to return to Nuc. Med at : 12:30 Patient discharged: Home Patient's Condition upon discharge was: stable Comments: During test 120/93 & HR 136.  Recovery BP 120/97 & HR 102.  Symptoms resolved in recovery. Seth Newton

## 2012-09-22 ENCOUNTER — Encounter: Payer: Medicare Other | Admitting: Adult Health

## 2012-09-22 NOTE — Progress Notes (Signed)
   Sent to ER due to syncopal episode in waiting room.

## 2013-02-24 ENCOUNTER — Encounter: Payer: Self-pay | Admitting: Cardiovascular Disease

## 2013-02-27 DIAGNOSIS — E871 Hypo-osmolality and hyponatremia: Secondary | ICD-10-CM | POA: Insufficient documentation

## 2013-02-27 DIAGNOSIS — F1020 Alcohol dependence, uncomplicated: Secondary | ICD-10-CM | POA: Insufficient documentation

## 2013-02-27 DIAGNOSIS — K852 Alcohol induced acute pancreatitis without necrosis or infection: Secondary | ICD-10-CM | POA: Insufficient documentation

## 2013-02-27 DIAGNOSIS — J690 Pneumonitis due to inhalation of food and vomit: Secondary | ICD-10-CM | POA: Insufficient documentation

## 2013-03-12 ENCOUNTER — Encounter (HOSPITAL_COMMUNITY): Payer: Self-pay | Admitting: Emergency Medicine

## 2013-03-12 ENCOUNTER — Emergency Department (HOSPITAL_COMMUNITY): Payer: Medicare Other

## 2013-03-12 ENCOUNTER — Inpatient Hospital Stay (HOSPITAL_COMMUNITY)
Admission: EM | Admit: 2013-03-12 | Discharge: 2013-03-19 | DRG: 308 | Disposition: A | Discharge status: Home Health | Payer: Medicare Other | Attending: Internal Medicine | Admitting: Internal Medicine

## 2013-03-12 DIAGNOSIS — G40909 Epilepsy, unspecified, not intractable, without status epilepticus: Secondary | ICD-10-CM | POA: Diagnosis present

## 2013-03-12 DIAGNOSIS — I4891 Unspecified atrial fibrillation: Principal | ICD-10-CM

## 2013-03-12 DIAGNOSIS — I5043 Acute on chronic combined systolic (congestive) and diastolic (congestive) heart failure: Secondary | ICD-10-CM | POA: Diagnosis present

## 2013-03-12 DIAGNOSIS — Z79899 Other long term (current) drug therapy: Secondary | ICD-10-CM

## 2013-03-12 DIAGNOSIS — I5022 Chronic systolic (congestive) heart failure: Secondary | ICD-10-CM

## 2013-03-12 DIAGNOSIS — D539 Nutritional anemia, unspecified: Secondary | ICD-10-CM | POA: Diagnosis present

## 2013-03-12 DIAGNOSIS — Z9119 Patient's noncompliance with other medical treatment and regimen: Secondary | ICD-10-CM

## 2013-03-12 DIAGNOSIS — R9431 Abnormal electrocardiogram [ECG] [EKG]: Secondary | ICD-10-CM

## 2013-03-12 DIAGNOSIS — F1010 Alcohol abuse, uncomplicated: Secondary | ICD-10-CM

## 2013-03-12 DIAGNOSIS — K5900 Constipation, unspecified: Secondary | ICD-10-CM | POA: Diagnosis present

## 2013-03-12 DIAGNOSIS — I5021 Acute systolic (congestive) heart failure: Secondary | ICD-10-CM

## 2013-03-12 DIAGNOSIS — Z7982 Long term (current) use of aspirin: Secondary | ICD-10-CM

## 2013-03-12 DIAGNOSIS — E876 Hypokalemia: Secondary | ICD-10-CM | POA: Diagnosis present

## 2013-03-12 DIAGNOSIS — I959 Hypotension, unspecified: Secondary | ICD-10-CM | POA: Diagnosis present

## 2013-03-12 DIAGNOSIS — I426 Alcoholic cardiomyopathy: Secondary | ICD-10-CM

## 2013-03-12 DIAGNOSIS — R109 Unspecified abdominal pain: Secondary | ICD-10-CM

## 2013-03-12 DIAGNOSIS — Z91199 Patient's noncompliance with other medical treatment and regimen due to unspecified reason: Secondary | ICD-10-CM

## 2013-03-12 DIAGNOSIS — B1920 Unspecified viral hepatitis C without hepatic coma: Secondary | ICD-10-CM | POA: Diagnosis present

## 2013-03-12 DIAGNOSIS — I10 Essential (primary) hypertension: Secondary | ICD-10-CM | POA: Diagnosis present

## 2013-03-12 DIAGNOSIS — G8929 Other chronic pain: Secondary | ICD-10-CM | POA: Diagnosis present

## 2013-03-12 DIAGNOSIS — R7402 Elevation of levels of lactic acid dehydrogenase (LDH): Secondary | ICD-10-CM | POA: Diagnosis present

## 2013-03-12 DIAGNOSIS — F1020 Alcohol dependence, uncomplicated: Secondary | ICD-10-CM | POA: Diagnosis present

## 2013-03-12 DIAGNOSIS — K7460 Unspecified cirrhosis of liver: Secondary | ICD-10-CM

## 2013-03-12 DIAGNOSIS — R55 Syncope and collapse: Secondary | ICD-10-CM

## 2013-03-12 DIAGNOSIS — I428 Other cardiomyopathies: Secondary | ICD-10-CM | POA: Diagnosis present

## 2013-03-12 DIAGNOSIS — Z8249 Family history of ischemic heart disease and other diseases of the circulatory system: Secondary | ICD-10-CM

## 2013-03-12 DIAGNOSIS — R601 Generalized edema: Secondary | ICD-10-CM

## 2013-03-12 DIAGNOSIS — D72819 Decreased white blood cell count, unspecified: Secondary | ICD-10-CM | POA: Diagnosis present

## 2013-03-12 DIAGNOSIS — R7401 Elevation of levels of liver transaminase levels: Secondary | ICD-10-CM

## 2013-03-12 DIAGNOSIS — I509 Heart failure, unspecified: Secondary | ICD-10-CM | POA: Diagnosis present

## 2013-03-12 HISTORY — DX: Chronic systolic (congestive) heart failure: I50.22

## 2013-03-12 HISTORY — DX: Unspecified atrial fibrillation: I48.91

## 2013-03-12 HISTORY — DX: Alcohol abuse, uncomplicated: F10.10

## 2013-03-12 HISTORY — DX: Acute pancreatitis without necrosis or infection, unspecified: K85.90

## 2013-03-12 HISTORY — DX: Unspecified viral hepatitis C without hepatic coma: B19.20

## 2013-03-12 LAB — URINALYSIS, ROUTINE W REFLEX MICROSCOPIC
Hgb urine dipstick: NEGATIVE
Ketones, ur: NEGATIVE mg/dL
Leukocytes, UA: NEGATIVE
Nitrite: NEGATIVE
Protein, ur: NEGATIVE mg/dL
Specific Gravity, Urine: 1.01 (ref 1.005–1.030)
Urobilinogen, UA: 0.2 mg/dL (ref 0.0–1.0)

## 2013-03-12 LAB — COMPREHENSIVE METABOLIC PANEL
Alkaline Phosphatase: 61 U/L (ref 39–117)
BUN: 3 mg/dL — ABNORMAL LOW (ref 6–23)
Chloride: 97 mEq/L (ref 96–112)
Creatinine, Ser: 0.62 mg/dL (ref 0.50–1.35)
GFR calc Af Amer: >90 mL/min (ref >90)
Glucose, Bld: 109 mg/dL — ABNORMAL HIGH (ref 70–99)
Potassium: 3.3 mEq/L — ABNORMAL LOW (ref 3.5–5.1)
Total Bilirubin: 0.5 mg/dL (ref 0.3–1.2)
Total Protein: 7 g/dL (ref 6.0–8.3)

## 2013-03-12 LAB — CBC WITH DIFFERENTIAL/PLATELET
Basophils Relative: 2 % — ABNORMAL HIGH (ref 0–1)
Eosinophils Absolute: 0.1 10*3/uL (ref 0.0–0.7)
HCT: 39.4 % (ref 39.0–52.0)
Hemoglobin: 12.8 g/dL — ABNORMAL LOW (ref 13.0–17.0)
Lymphs Abs: 0.5 10*3/uL — ABNORMAL LOW (ref 0.7–4.0)
MCH: 33.2 pg (ref 26.0–34.0)
MCHC: 32.5 g/dL (ref 30.0–36.0)
Monocytes Absolute: 0.3 10*3/uL (ref 0.1–1.0)
Monocytes Relative: 10 % (ref 3–12)
Neutrophils Relative %: 69 % (ref 43–77)
Platelets: 223 10*3/uL (ref 150–400)
RBC: 3.85 MIL/uL — ABNORMAL LOW (ref 4.22–5.81)

## 2013-03-12 LAB — LIPASE, BLOOD: Lipase: 32 U/L (ref 11–59)

## 2013-03-12 LAB — AMMONIA: Ammonia: 35 umol/L (ref 11–60)

## 2013-03-12 LAB — TROPONIN I: Troponin I: <0.3 ng/mL (ref <0.30)

## 2013-03-12 LAB — LACTIC ACID, PLASMA: Lactic Acid, Venous: 1.1 mmol/L (ref 0.5–2.2)

## 2013-03-12 MED ORDER — MORPHINE SULFATE 4 MG/ML IJ SOLN
4.0000 mg | Freq: Once | INTRAMUSCULAR | Status: AC
  Administered 2013-03-12: 4 mg via INTRAVENOUS
  Filled 2013-03-12: qty 1

## 2013-03-12 MED ORDER — DEXTROSE 5 % IV SOLN
1.0000 g | Freq: Once | INTRAVENOUS | Status: AC
  Administered 2013-03-12: 1 g via INTRAVENOUS
  Filled 2013-03-12: qty 10

## 2013-03-12 MED ORDER — THIAMINE HCL 100 MG/ML IJ SOLN: 100.0000 mg | Freq: Every day | INTRAMUSCULAR | Status: DC

## 2013-03-12 MED ORDER — DILTIAZEM HCL 25 MG/5ML IV SOLN
20.0000 mg | Freq: Once | INTRAVENOUS | Status: AC
  Administered 2013-03-12: 20 mg via INTRAVENOUS
  Filled 2013-03-12: qty 5

## 2013-03-12 MED ORDER — METOPROLOL TARTRATE 25 MG PO TABS
25.0000 mg | ORAL_TABLET | Freq: Two times a day (BID) | ORAL | Status: DC
  Administered 2013-03-12 – 2013-03-13 (×2): 25 mg via ORAL
  Filled 2013-03-12 (×2): qty 1

## 2013-03-12 MED ORDER — SODIUM CHLORIDE 0.9 % IJ SOLN
3.0000 mL | INTRAMUSCULAR | Status: DC | PRN
  Administered 2013-03-13: 3 mL via INTRAVENOUS
  Administered 2013-03-14: 18:00:00 via INTRAVENOUS
  Administered 2013-03-14: 3 mL via INTRAVENOUS

## 2013-03-12 MED ORDER — ONDANSETRON HCL 4 MG/2ML IJ SOLN
4.0000 mg | Freq: Once | INTRAMUSCULAR | Status: AC
  Administered 2013-03-12: 4 mg via INTRAVENOUS
  Filled 2013-03-12: qty 2

## 2013-03-12 MED ORDER — IOHEXOL 300 MG/ML  SOLN
100.0000 mL | Freq: Once | INTRAMUSCULAR | Status: AC | PRN
  Administered 2013-03-12: 100 mL via INTRAVENOUS

## 2013-03-12 MED ORDER — DOCUSATE SODIUM 100 MG PO CAPS
100.0000 mg | ORAL_CAPSULE | Freq: Every day | ORAL | Status: DC
  Administered 2013-03-12 – 2013-03-19 (×4): 100 mg via ORAL
  Filled 2013-03-12 (×6): qty 1

## 2013-03-12 MED ORDER — ASPIRIN EC 81 MG PO TBEC
81.0000 mg | DELAYED_RELEASE_TABLET | Freq: Every day | ORAL | Status: DC
  Administered 2013-03-12 – 2013-03-19 (×8): 81 mg via ORAL
  Filled 2013-03-12 (×8): qty 1

## 2013-03-12 MED ORDER — FOLIC ACID 1 MG PO TABS
1.0000 mg | ORAL_TABLET | Freq: Every day | ORAL | Status: DC
  Administered 2013-03-12 – 2013-03-19 (×8): 1 mg via ORAL
  Filled 2013-03-12 (×8): qty 1

## 2013-03-12 MED ORDER — DILTIAZEM HCL 100 MG IV SOLR
5.0000 mg/h | INTRAVENOUS | Status: DC
  Administered 2013-03-12: 15 mg/h via INTRAVENOUS
  Administered 2013-03-12: 5 mg/h via INTRAVENOUS

## 2013-03-12 MED ORDER — ADULT MULTIVITAMIN W/MINERALS CH
1.0000 | ORAL_TABLET | Freq: Every day | ORAL | Status: DC
  Administered 2013-03-12 – 2013-03-19 (×8): 1 via ORAL
  Filled 2013-03-12 (×8): qty 1

## 2013-03-12 MED ORDER — FUROSEMIDE 10 MG/ML IJ SOLN
40.0000 mg | Freq: Two times a day (BID) | INTRAMUSCULAR | Status: DC
  Administered 2013-03-12 – 2013-03-15 (×6): 40 mg via INTRAVENOUS
  Filled 2013-03-12 (×6): qty 4

## 2013-03-12 MED ORDER — ONDANSETRON HCL 4 MG/2ML IJ SOLN: 4.0000 mg | Freq: Three times a day (TID) | INTRAMUSCULAR | Status: DC | PRN

## 2013-03-12 MED ORDER — DIAZEPAM 5 MG PO TABS: 10.0000 mg | ORAL_TABLET | Freq: Two times a day (BID) | ORAL | Status: DC | PRN

## 2013-03-12 MED ORDER — PANTOPRAZOLE SODIUM 40 MG PO TBEC
40.0000 mg | DELAYED_RELEASE_TABLET | Freq: Every day | ORAL | Status: DC
  Administered 2013-03-12 – 2013-03-19 (×8): 40 mg via ORAL
  Filled 2013-03-12 (×8): qty 1

## 2013-03-12 MED ORDER — DILTIAZEM HCL 25 MG/5ML IV SOLN
10.0000 mg | Freq: Once | INTRAVENOUS | Status: AC
  Administered 2013-03-12: 10 mg via INTRAVENOUS

## 2013-03-12 MED ORDER — DIAZEPAM 5 MG PO TABS
10.0000 mg | ORAL_TABLET | Freq: Two times a day (BID) | ORAL | Status: DC | PRN
  Administered 2013-03-12 – 2013-03-19 (×8): 10 mg via ORAL
  Filled 2013-03-12 (×9): qty 2

## 2013-03-12 MED ORDER — ENOXAPARIN SODIUM 40 MG/0.4ML ~~LOC~~ SOLN
40.0000 mg | SUBCUTANEOUS | Status: DC
  Administered 2013-03-12 – 2013-03-18 (×7): 40 mg via SUBCUTANEOUS
  Filled 2013-03-12 (×7): qty 0.4

## 2013-03-12 MED ORDER — LORAZEPAM 2 MG/ML IJ SOLN
1.0000 mg | Freq: Four times a day (QID) | INTRAMUSCULAR | Status: AC | PRN
  Administered 2013-03-13 – 2013-03-14 (×4): 1 mg via INTRAVENOUS
  Filled 2013-03-12 (×4): qty 1

## 2013-03-12 MED ORDER — SODIUM CHLORIDE 0.9 % IV BOLUS (SEPSIS)
1000.0000 mL | Freq: Once | INTRAVENOUS | Status: AC
  Administered 2013-03-12: 1000 mL via INTRAVENOUS

## 2013-03-12 MED ORDER — DILTIAZEM HCL 100 MG IV SOLR
5.0000 mg/h | INTRAVENOUS | Status: DC
  Administered 2013-03-12: 15 mg/h via INTRAVENOUS
  Filled 2013-03-12: qty 100

## 2013-03-12 MED ORDER — SODIUM CHLORIDE 0.9 % IV SOLN: 250.0000 mL | INTRAVENOUS | Status: DC | PRN

## 2013-03-12 MED ORDER — LORAZEPAM 0.5 MG PO TABS
1.0000 mg | ORAL_TABLET | Freq: Four times a day (QID) | ORAL | Status: AC | PRN
  Administered 2013-03-13 – 2013-03-15 (×6): 1 mg via ORAL
  Filled 2013-03-12 (×7): qty 2

## 2013-03-12 MED ORDER — VITAMIN B-1 100 MG PO TABS
100.0000 mg | ORAL_TABLET | Freq: Every day | ORAL | Status: DC
  Administered 2013-03-12 – 2013-03-19 (×8): 100 mg via ORAL
  Filled 2013-03-12 (×8): qty 1

## 2013-03-12 MED ORDER — ONDANSETRON HCL 4 MG/2ML IJ SOLN
4.0000 mg | Freq: Four times a day (QID) | INTRAMUSCULAR | Status: DC | PRN
  Administered 2013-03-13 – 2013-03-17 (×3): 4 mg via INTRAVENOUS
  Filled 2013-03-12 (×3): qty 2

## 2013-03-12 MED ORDER — LISINOPRIL 5 MG PO TABS
2.5000 mg | ORAL_TABLET | Freq: Every day | ORAL | Status: DC
  Administered 2013-03-12 – 2013-03-18 (×7): 2.5 mg via ORAL
  Filled 2013-03-12 (×7): qty 1

## 2013-03-12 MED ORDER — IOHEXOL 300 MG/ML  SOLN
50.0000 mL | Freq: Once | INTRAMUSCULAR | Status: AC | PRN
  Administered 2013-03-12: 50 mL via ORAL

## 2013-03-12 MED ORDER — ACETAMINOPHEN 325 MG PO TABS
650.0000 mg | ORAL_TABLET | ORAL | Status: DC | PRN
  Administered 2013-03-12 – 2013-03-14 (×3): 650 mg via ORAL
  Filled 2013-03-12 (×3): qty 2

## 2013-03-12 MED ORDER — HYDROMORPHONE HCL PF 1 MG/ML IJ SOLN
1.0000 mg | INTRAMUSCULAR | Status: DC | PRN
  Administered 2013-03-12: 1 mg via INTRAVENOUS
  Filled 2013-03-12: qty 1

## 2013-03-12 MED ORDER — OXYCODONE HCL 5 MG PO TABS
5.0000 mg | ORAL_TABLET | Freq: Four times a day (QID) | ORAL | Status: DC | PRN
  Administered 2013-03-12 – 2013-03-18 (×22): 5 mg via ORAL
  Filled 2013-03-12 (×22): qty 1

## 2013-03-12 MED ORDER — SODIUM CHLORIDE 0.9 % IJ SOLN
3.0000 mL | Freq: Two times a day (BID) | INTRAMUSCULAR | Status: DC
  Administered 2013-03-12 – 2013-03-18 (×11): 3 mL via INTRAVENOUS

## 2013-03-12 MED ORDER — METOPROLOL TARTRATE 1 MG/ML IV SOLN
5.0000 mg | Freq: Once | INTRAVENOUS | Status: AC
  Administered 2013-03-12: 5 mg via INTRAVENOUS
  Filled 2013-03-12: qty 5

## 2013-03-12 MED ORDER — POTASSIUM CHLORIDE CRYS ER 20 MEQ PO TBCR
40.0000 meq | EXTENDED_RELEASE_TABLET | Freq: Two times a day (BID) | ORAL | Status: DC
  Administered 2013-03-12 – 2013-03-18 (×12): 40 meq via ORAL
  Filled 2013-03-12 (×13): qty 2

## 2013-03-12 NOTE — ED Provider Notes (Signed)
CSN: [id number]     Arrival date & time 03/12/13  4098 History  This chart was scribed for Shanna Cisco, MD by Quintella Reichert, ED scribe.  This patient was seen in room APA04/APA04 and the patient's care was started at 9:05 AM.   Chief Complaint  Patient presents with  . Abdominal Pain  . Emesis  . Leg Swelling  . Groin Swelling    Patient is a 47 y.o. male presenting with abdominal pain. The history is provided by the patient. No language interpreter was used.  Abdominal Pain Pain location:  Generalized Pain quality: throbbing   Pain severity:  Moderate Duration: several days. Progression:  Worsening Context: previous surgery and recent illness   Associated symptoms: chills, constipation (mild), fever, nausea and vomiting   Associated symptoms: no chest pain, no cough, no diarrhea and no shortness of breath   Risk factors: recent hospitalization     HPI Comments: ALTIN SEASE is a 47 y.o. male with h/o HTN and recent hospitalization for pancreatitis and pneumonia who presents to the Emergency Department complaining of several days of worsening swelling to his testicles and legs with associated confusion, generalized weakness, nausea, vomiting, pallor, hot and cold spells, and difficulty urinating.  Pt states that 2-3 days ago he noticed that his testicles were becoming swollen.  This persisted and has become progressively more severe.  He also presents with swelling and redness to both legs although he is unsure when this started.  Mother states that she noticed some leg swelling when he was in the hospital several weeks ago but she did not notice redness at that time.  Pt also complains of throbbing pain extending from his knees to his upper abdomen.   He states he has felt hot and cold on-and-off but has not taken his temperature.  On arrival temperature is 98.4 F.  Pt states he has been vomiting for several days.  Emesis is described as "grayish" without blood.  Mother states he  appears slightly pale.  Pt also reports that he has been confused intermittently and mother verifies that he has seemed confused and has had some difficulty remembering things.  Mother states he has been urinating frequently and sometimes when he attempts to urinate he is not able to.  Pt confirms that he has has had difficulty urinating.  Pt states he has also had some constipation although he last moved his bowels this morning.  He denies diarrhea or cough.  Mother reports that pt was brought by EMS to [facility] on 11/28 and transferred to [facility] for pneumonia and pancreatitis.  He was discharged on 12/9 and placed on multiple new medication.  Pt denies prior h/o pancreatitis and states he does not know what caused it.  He is an occasional drinker.  He denies illicit drug use including IV drugs.  He denies h/o gallbladder issues but has never had it checked to his knowledge.  He admits to h/o abdominal surgery for ulcers, left ankle surgery, and neck surgery.  He admits to previous h/o blood in stools/hematemesis which he states was most likely caused by his ulcers.  Pt did not receive his medications this morning.  He has otherwise been taking his medications as instructed.  He denies recent sick contacts since discharge from the hospital.    Patient Active Problem List   Diagnosis Date Noted  . Atrial fibrillation with rapid ventricular response 03/12/2013  . Vasovagal syncope 09/08/2012  . Abnormal EKG 09/07/2012  .  Chronic pain 09/07/2012  . Syncope 09/07/2012  . Seizure disorder 09/07/2012  . Hypokalemia 09/07/2012  . Essential hypertension, benign 09/07/2012    Past Medical History  Diagnosis Date  . Seizures   . Chronic back pain   . Chronic neck pain   . Essential hypertension, benign   . A-fib   . Hepatitis C     Past Surgical History  Procedure Laterality Date  . Back surgery      neck  . Abdominal surgery      for gastric ulcers?    Family History   Problem Relation Age of Onset  . Hypertension Father     History  Substance Use Topics  . Smoking status: Never Smoker   . Smokeless tobacco: Never Used  . Alcohol Use: No     Review of Systems  Constitutional: Positive for fever and chills.  HENT: Negative for congestion, facial swelling, rhinorrhea and trouble swallowing.   Eyes: Negative for photophobia and pain.  Respiratory: Negative for cough, chest tightness and shortness of breath.   Cardiovascular: Positive for leg swelling. Negative for chest pain.  Gastrointestinal: Positive for nausea, vomiting, abdominal pain and constipation (mild). Negative for diarrhea.  Endocrine: Negative for polydipsia and polyuria.  Genitourinary: Positive for frequency, scrotal swelling and difficulty urinating.  Musculoskeletal: Negative for back pain and gait problem.  Skin: Positive for color change (erythema) and pallor. Negative for rash and wound.  Allergic/Immunologic: Negative for immunocompromised state.  Neurological: Positive for weakness (generalized). Negative for dizziness, facial asymmetry, speech difficulty and numbness.  Psychiatric/Behavioral: Positive for confusion. Negative for agitation.     Allergies  Gabapentin and Penicillins  Home Medications   No current outpatient prescriptions on file. BP 148/94  Pulse 101  Temp(Src) 98.4 F (36.9 C) (Oral)  Resp 20  Ht 6' (1.829 m)  Wt 210 lb (95.255 kg)  BMI 28.47 kg/m2  SpO2 100%  Physical Exam  Nursing note and vitals reviewed. Constitutional: He is oriented to person, place, and time. He appears well-developed and well-nourished. No distress.  HENT:  Head: Normocephalic and atraumatic.  Mouth/Throat: No oropharyngeal exudate.  Eyes: Pupils are equal, round, and reactive to light.  Neck: Normal range of motion. Neck supple.  Cardiovascular: Normal heart sounds.  An irregularly irregular rhythm present. Tachycardia present.  Exam reveals no gallop and no  friction rub.   No murmur heard. Pulmonary/Chest: Effort normal and breath sounds normal. No respiratory distress. He has no wheezes. He has no rales.  Abdominal: Soft. Bowel sounds are normal. He exhibits distension. He exhibits no mass. There is tenderness (generalized). There is no rebound and no guarding.  Blanchable erythema from about 3 cm above umbilicus down  Genitourinary:  Scrotal and penile edema bilaterally  Musculoskeletal: Normal range of motion. He exhibits edema. He exhibits no tenderness.  Pitting edema throughout bilateral legs  Neurological: He is alert and oriented to person, place, and time.  Skin: Skin is warm and dry. There is erythema. There is pallor.  Psychiatric: He has a normal mood and affect.    ED Course  Procedures (including critical care time)  DIAGNOSTIC STUDIES: Oxygen Saturation is 100% on room air, normal by my interpretation.    COORDINATION OF CARE: 9:39 AM-Discussed treatment plan which includes pain medication, anti-emetics, EKG and labs with pt at bedside and pt agreed to plan.    Labs Review Labs Reviewed  CBC WITH DIFFERENTIAL - Abnormal; Notable for the following:    WBC 3.2 (*)  RBC 3.85 (*)    Hemoglobin 12.8 (*)    MCV 102.3 (*)    Lymphs Abs 0.5 (*)    Basophils Relative 2 (*)    All other components within normal limits  COMPREHENSIVE METABOLIC PANEL - Abnormal; Notable for the following:    Potassium 3.3 (*)    CO2 33 (*)    Glucose, Bld 109 (*)    BUN 3 (*)    Albumin 3.4 (*)    AST 63 (*)    ALT 59 (*)    All other components within normal limits  PRO B NATRIURETIC PEPTIDE - Abnormal; Notable for the following:    Pro B Natriuretic peptide (BNP) 1190.0 (*)    All other components within normal limits  CULTURE, BLOOD (ROUTINE X 2)  CULTURE, BLOOD (ROUTINE X 2)  URINE CULTURE  BODY FLUID CULTURE  LACTIC ACID, PLASMA  LIPASE, BLOOD  URINALYSIS, ROUTINE W REFLEX MICROSCOPIC  AMMONIA  LACTATE DEHYDROGENASE, BODY  FLUID  GLUCOSE, PERITONEAL FLUID  PROTEIN, BODY FLUID  ALBUMIN, FLUID    Imaging Review Dg Chest 2 View  03/12/2013   CLINICAL DATA:  Worsening and peripheral edema  EXAM: CHEST  2 VIEW  COMPARISON:  09/07/2012  FINDINGS: Cardiac shadow is mildly enlarged. No focal infiltrate or sizable effusion is seen. The bony structures are within normal limits.  IMPRESSION: Mild cardiomegaly.  No other focal abnormality is seen.   Electronically Signed   By: Alcide Clever M.D.   On: 03/12/2013 12:05   Ct Abdomen Pelvis W Contrast  03/12/2013   CLINICAL DATA:  Abdominal pain  EXAM: CT ABDOMEN AND PELVIS WITH CONTRAST  TECHNIQUE: Multidetector CT imaging of the abdomen and pelvis was performed using the standard protocol following bolus administration of intravenous contrast.  CONTRAST:  50mL OMNIPAQUE IOHEXOL 300 MG/ML SOLN, OMNIPAQUE IOHEXOL 300 MG/ML SOLN  COMPARISON:  02/24/2013  FINDINGS: The lung bases are free of acute infiltrate or sizable effusion. The previously seen changes have resolved in the interval.  The liver is diffusely fatty infiltrated. The gallbladder is well distended without focal abnormality. The spleen, adrenal glands and pancreas are unremarkable. The previously seen inflammatory changes surrounding the head of the pancreas and duodenum have resolved in the interval from the prior exam. The kidneys are within normal limits bilaterally. Delayed images demonstrate normal excretion of contrast bilaterally.  The appendix is not well visualized. No inflammatory changes to suggest appendicitis are noted. Diffuse diverticular change is noted without diverticulitis. The bladder is well distended. No pelvic mass lesion or sidewall abnormality is seen. The osseous structures show no acute abnormality. Mild soft tissue edematous changes are noted which may be related to some 3rd spacing. This is new from the prior exam.  IMPRESSION: Resolution of previously seen peripancreatic and periduodenal  inflammatory changes.  New soft tissue edematous changes of the abdominal wall  Chronic changes as described.   Electronically Signed   By: Alcide Clever M.D.   On: 03/12/2013 12:00    EKG Interpretation    Date/Time:  Sunday March 12 2013 09:52:47 EST Ventricular Rate:  160 PR Interval:    QRS Duration: 82 QT Interval:  260 QTC Calculation: 424 R Axis:   90 Text Interpretation:  Atrial fibrillation with rapid ventricular response Rightward axis ST \\T \ T wave abnormality, consider inferior ischemia Abnormal ECG When compared with ECG of 12-Sep-2012 09:09, Atrial fibrillation has replaced Sinus rhythm Vent. rate has increased BY  65 BPM Nonspecific T wave  abnormality has replaced inverted T waves in Inferior leads T wave inversion no longer evident in Anterior leads Confirmed by Starsha Morning  MD, Vanna Shavers ([id number]) on 03/12/2013 10:05:07 AM            MDM   1. Atrial fibrillation with rapid ventricular response   2. Anasarca   3. Congestive heart failure   4. Abdominal pain   5. Hepatic cirrhosis    Pt is a 47 y.o. male with Pmhx as above who presents with 2-3 days edema to BLLE up to mid abdomen including penis and BL testiclesw/ erythema, as well as abdominal pain, n/v, d/a (since resolved), subjective fevers, chills. Pt recently had 10d hospitalization at [facility] for pna, pancreatitis, afib w/ RVR.  On PE, pt pale, uncomfortable appearing, but non-toxic.  Abdomen distended, with generalized tenderness.  Scrotum/penis edematous, but not focally ender, as are BLLE.  He has blanchable erythmea from mid abdomen to feet that is symmetric.  Pt found ot be in afib a/ RVR shortly after arrival. Mother states she did not give his new afib meds at home today.   2:02PM Pt's HR still in 120's after being started on IV dilt gtt.  CXR shows cardiomegaly, BNP elevated.  CT ab/pelvis shows resolution of pancreatitis, lipase not elevated, ammonia not elevated, WBC no elevated.  I feel skin changes  represent anasarca and not widespread cellulitis.  Have done brief bedside US for consideration of paracentesis to r/o SBP, but pt did not have large enough pocket of ascites to bedside procedure.  BP remains stable. Pt complaining mainly of leg pain. Pt given 1g IV rocephin for possible SBP coverage.  Hospitalist will admit to stepdown unit.     I personally performed the services described in this documentation, which was scribed in my presence. The recorded information has been reviewed and is accurate.     Shanna Cisco, MD 03/13/13 1124

## 2013-03-12 NOTE — ED Notes (Signed)
Pt showing Afib 130-160 on monitor, EDP aware.

## 2013-03-12 NOTE — ED Notes (Addendum)
Patient c/o generalized abd pain with nausea and vomiting x1. Patient reports some diarrhea but denies in past 2 days. Patient also c/o swelling in lower extremities, groin, testicles, and abd. Abd red and warm to touch. Patient unsure of any fevers.

## 2013-03-12 NOTE — H&P (Signed)
History and Physical  Seth Newton ZOX:[id number] DOB: 02-26-1966 DOA: 03/12/2013  Referring physician: Toy Cookey, MD in ED PCP: No PCP Per Patient   Chief Complaint: Leg pain  HPI:  47 year old man who presented to the emergency department with leg swelling, abdominal swelling and pain. He was found to have atrial fibrillation with rapid ventricular response in anasarca and was referred for admission.  Patient was hospitalized at [facility] and discharged 12/9 for acute pancreatitis secondary to alcohol abuse, atrial fibrillation with rapid ventricular response, systolic heart failure with ejection fraction 30%, aspiration pneumonitis, alcohol and opioid withdrawal (according to discharge summary the patient was using Opana and oxycodone tablets IV). He was discharged on diltiazem, metoprolol, methadone taper. No diuretic.  Since discharge he has done fairly well except he has had increasing lower extremity edema that is now up to his abdomen and causes general body discomfort without focal discomfort. He came to the hospital today because of increasing lower extremity edema and generalized pain.  In the emergency Department noted to be afebrile, tachycardic with mild hypoxia but nontoxic. Laboratory studies were notable for mild transaminitis, elevated BNP 1190, normal lactic acid, leukopenia, hemoglobin 12.8. Urinalysis was negative. CT abdomen and pelvis revealed resolution of pancreatitis. EKG not acute.  Review of Systems:  Negative for fever, visual changes, sore throat, rash, chest pain, SOB, dysuria, bleeding, n/v/abdominal pain.  Past Medical History  Diagnosis Date  . Seizures   . Chronic back pain   . Chronic neck pain   . Essential hypertension, benign   . A-fib   . Hepatitis C   . Chronic systolic congestive heart failure 03/12/2013  . Atrial fibrillation 03/12/2013    Past Surgical History  Procedure Laterality Date  . Back surgery      neck  . Abdominal surgery       for gastric ulcers?    Social History:  reports that he has never smoked. He has never used smokeless tobacco. He reports that he does not drink alcohol or use illicit drugs.  Allergies  Allergen Reactions  . Gabapentin Other (See Comments)    Unknown   . Penicillins Rash    Family History  Problem Relation Age of Onset  . Hypertension Father      Prior to Admission medications   Medication Sig Start Date End Date Taking? Authorizing Provider  aspirin EC 81 MG tablet Take 81 mg by mouth daily.   Yes Historical Provider, MD  diazepam (VALIUM) 10 MG tablet Take 10 mg by mouth 2 (two) times daily.   Yes Historical Provider, MD  diltiazem (CARDIZEM SR) 90 MG 12 hr capsule Take 180 mg by mouth every 12 (twelve) hours.   Yes Historical Provider, MD  docusate sodium (COLACE) 100 MG capsule Take 100 mg by mouth daily.   Yes Historical Provider, MD  folic acid (FOLVITE) 1 MG tablet Take 1 mg by mouth daily.   Yes Historical Provider, MD  metoprolol tartrate (LOPRESSOR) 25 MG tablet Take by mouth 2 (two) times daily.   Yes Historical Provider, MD  Multiple Vitamin (MULTIVITAMIN WITH MINERALS) TABS tablet Take 1 tablet by mouth daily.   Yes Historical Provider, MD  oxycodone (ROXICODONE) 30 MG immediate release tablet Take 30 mg by mouth 5 (five) times daily.   Yes Historical Provider, MD  Oxycodone HCl 10 MG TABS Take 10 mg by mouth every 6 (six) hours as needed (pain).   Yes Historical Provider, MD  oxymetazoline (AFRIN) 0.05 % nasal spray  Place 2 sprays into the nose 2 (two) times daily as needed for congestion.   Yes Historical Provider, MD  oxymorphone (OPANA ER) 40 MG 12 hr tablet Take 40 mg by mouth every 12 (twelve) hours.   Yes Historical Provider, MD  pantoprazole (PROTONIX) 40 MG tablet Take 40 mg by mouth daily.   Yes Historical Provider, MD  promethazine (PHENERGAN) 25 MG tablet Take 25 mg by mouth every 6 (six) hours as needed for nausea or vomiting.   Yes Historical Provider,  MD  thiamine 100 MG tablet Take 100 mg by mouth daily.   Yes Historical Provider, MD  methadone (DOLOPHINE) 5 MG tablet Take 2.5-5 mg by mouth See admin instructions. Take 5 mg on 03/07/13.  Take 2.5 mg twice daily on 03/08/2013.  Take 2.5 mg daily on 03/09/13 and 03/10/13.    Historical Provider, MD   Physical Exam: Filed Vitals:   03/12/13 1316 03/12/13 1426 03/12/13 1445 03/12/13 1501  BP:   113/52 89/62  Pulse: 126 45 78   Temp:      TempSrc:      Resp: 18 16 28 21   Height:      Weight:      SpO2: 100% 93% 96%     General: Examined emergency department. Appears calm and comfortable Eyes: PERRL, normal lids, irises  ENT: grossly normal hearing, lips & tongue Neck: no LAD, masses or thyromegaly Cardiovascular: Tachycardic, regular, no murmur, rub or gallop. Anasarca with 3+ edema from lower abdomen and feet including significant scrotal and penile edema. Respiratory: CTA bilaterally, no w/r/r. Normal respiratory effort. GU: Normal-appearing penis and scrotum, testicles which are edematous. No erythema. Perineum normal. Abdomen: soft, ntnd, significant body wall edema seen Skin: Edema of the skin from abdomen down, mild redness without significant erythema. No fluctuance. No evidence of cellulitis. Musculoskeletal: grossly normal tone BUE/BLE Psychiatric: grossly normal mood and affect, speech fluent and appropriate Neurologic: grossly non-focal.  Wt Readings from Last 3 Encounters:  03/12/13 95.255 kg (210 lb)  09/08/12 82.963 kg (182 lb 14.4 oz)    Labs on Admission:  Basic Metabolic Panel:  Recent Labs Lab 03/12/13 1000  NA 138  K 3.3*  CL 97  CO2 33*  GLUCOSE 109*  BUN 3*  CREATININE 0.62  CALCIUM 9.2    Liver Function Tests:  Recent Labs Lab 03/12/13 1000  AST 63*  ALT 59*  ALKPHOS 61  BILITOT 0.5  PROT 7.0  ALBUMIN 3.4*    Recent Labs Lab 03/12/13 1000  LIPASE 32    Recent Labs Lab 03/12/13 1047  AMMONIA 35    CBC:  Recent Labs Lab  03/12/13 1000  WBC 3.2*  NEUTROABS 2.2  HGB 12.8*  HCT 39.4  MCV 102.3*  PLT 223     Recent Labs  03/12/13 1000  PROBNP 1190.0*    CBG: No results found for this basename: GLUCAP,  in the last 168 hours   Radiological Exams on Admission: Dg Chest 2 View  03/12/2013   CLINICAL DATA:  Worsening and peripheral edema  EXAM: CHEST  2 VIEW  COMPARISON:  09/07/2012  FINDINGS: Cardiac shadow is mildly enlarged. No focal infiltrate or sizable effusion is seen. The bony structures are within normal limits.  IMPRESSION: Mild cardiomegaly.  No other focal abnormality is seen.   Electronically Signed   By: Alcide Clever M.D.   On: 03/12/2013 12:05   Ct Abdomen Pelvis W Contrast  03/12/2013   CLINICAL DATA:  Abdominal pain  EXAM: CT ABDOMEN AND PELVIS WITH CONTRAST  TECHNIQUE: Multidetector CT imaging of the abdomen and pelvis was performed using the standard protocol following bolus administration of intravenous contrast.  CONTRAST:  50mL OMNIPAQUE IOHEXOL 300 MG/ML SOLN, OMNIPAQUE IOHEXOL 300 MG/ML SOLN  COMPARISON:  02/24/2013  FINDINGS: The lung bases are free of acute infiltrate or sizable effusion. The previously seen changes have resolved in the interval.  The liver is diffusely fatty infiltrated. The gallbladder is well distended without focal abnormality. The spleen, adrenal glands and pancreas are unremarkable. The previously seen inflammatory changes surrounding the head of the pancreas and duodenum have resolved in the interval from the prior exam. The kidneys are within normal limits bilaterally. Delayed images demonstrate normal excretion of contrast bilaterally.  The appendix is not well visualized. No inflammatory changes to suggest appendicitis are noted. Diffuse diverticular change is noted without diverticulitis. The bladder is well distended. No pelvic mass lesion or sidewall abnormality is seen. The osseous structures show no acute abnormality. Mild soft tissue edematous  changes are noted which may be related to some 3rd spacing. This is new from the prior exam.  IMPRESSION: Resolution of previously seen peripancreatic and periduodenal inflammatory changes.  New soft tissue edematous changes of the abdominal wall  Chronic changes as described.   Electronically Signed   By: Alcide Clever M.D.   On: 03/12/2013 12:00    EKG: Independently reviewed. Atrial fibrillation with rapid ventricular response, nonspecific ST changes.   Principal Problem:   Atrial fibrillation with rapid ventricular response Active Problems:   Hypokalemia   Atrial fibrillation   Anasarca   Acute systolic CHF (congestive heart failure)   Transaminitis   Macrocytic anemia   Alcohol abuse   Assessment/Plan 1. Atrial fibrillation with rapid ventricular response, likely secondary to missed doses of rate control agents. 2. Anasarca, secondary to heart failure. He mostly describes pain in the legs downward from edema. I do not think there is any history to suggest SBP at this point. 3. Acute systolic congestive heart failure, left ventricular ejection fraction 30% by 2-D echocardiogram at [facility] 02/2013. 4. Hypokalemia 5. Transaminitis, likely secondary to hepatic pseudocyst or cirrhosis. Ammonia normal.  6. Macrocytic anemia, stable compared to previous laboratory studies at [facility]  7. Recent acute pancreatitis secondary to alcohol abuse, resolved by imaging and lipase.  8. Recent history of alcohol and opioid withdrawal, recent history of IV drug use 9. Alcohol abuse  10. History of seizure disorder 11. Hepatitis C   Admit to step down unit for IV rate control, hopefully can quickly transitioned to oral medications. Hopefully can transition off diltiazem given depressed ejection fraction.   Cardiology consult in the morning.  Aggressive diuresis, monitor I/O., daily weights  BMP and CBC in the morning  Monitor for alcohol withdrawal, narcotic withdrawal  Discussed above with  the patient and mother at bedside, all questions answered  Code Status: Full code  DVT prophylaxis:Lovenox  Family Communication:  Disposition Plan/Anticipated LOS: Admit. 2-4 days.  Time spent: 60  minutes  Brendia Sacks, MD  [facility] Pager [phone] 03/12/2013, 4:34 PM

## 2013-03-12 NOTE — ED Notes (Signed)
Pt's abdomen very warm to touch, pt states feeling some relief from pain and nausea.

## 2013-03-12 NOTE — ED Notes (Signed)
Pt's O2 sats dropping to 85%, placed pt on 3L O2 [place], pt responded, currently 100%.

## 2013-03-13 DIAGNOSIS — D539 Nutritional anemia, unspecified: Secondary | ICD-10-CM

## 2013-03-13 LAB — BASIC METABOLIC PANEL
BUN: 3 mg/dL — ABNORMAL LOW (ref 6–23)
CO2: 36 mEq/L — ABNORMAL HIGH (ref 19–32)
Chloride: 101 mEq/L (ref 96–112)
GFR calc non Af Amer: >90 mL/min (ref >90)
Glucose, Bld: 94 mg/dL (ref 70–99)
Potassium: 3.6 mEq/L (ref 3.5–5.1)
Sodium: 143 mEq/L (ref 135–145)

## 2013-03-13 LAB — CBC
HCT: 36.7 % — ABNORMAL LOW (ref 39.0–52.0)
Hemoglobin: 11.5 g/dL — ABNORMAL LOW (ref 13.0–17.0)
MCH: 33.2 pg (ref 26.0–34.0)
MCHC: 31.3 g/dL (ref 30.0–36.0)
MCV: 106.1 fL — ABNORMAL HIGH (ref 78.0–100.0)
RBC: 3.46 MIL/uL — ABNORMAL LOW (ref 4.22–5.81)
WBC: 2.8 10*3/uL — ABNORMAL LOW (ref 4.0–10.5)

## 2013-03-13 LAB — URINE CULTURE

## 2013-03-13 LAB — TROPONIN I: Troponin I: <0.3 ng/mL (ref <0.30)

## 2013-03-13 MED ORDER — METOPROLOL TARTRATE 25 MG PO TABS
25.0000 mg | ORAL_TABLET | Freq: Once | ORAL | Status: AC
  Administered 2013-03-13: 25 mg via ORAL
  Filled 2013-03-13: qty 1

## 2013-03-13 MED ORDER — METOPROLOL TARTRATE 50 MG PO TABS
50.0000 mg | ORAL_TABLET | Freq: Two times a day (BID) | ORAL | Status: DC
  Administered 2013-03-13: 50 mg via ORAL
  Filled 2013-03-13: qty 1

## 2013-03-13 NOTE — Progress Notes (Signed)
[facility] PROGRESS NOTE  Seth Newton JXB:[id number] DOB: 1966-02-18 DOA: 03/12/2013 PCP: No PCP Per Patient  Assessment/Plan: 1. Atrial fibrillation with rapid ventricular response, likely secondary to missed dose every control agents at home; hypotensive on Cardizem infusion. Heart rate up to 120s but relatively asymptomatic.  2. Anasarca secondary to acute heart failure: Modest improvement. Creatinine preserved. 3. Acute systolic congestive heart failure, left ventricular ejection fraction 30% by 2-D echocardiogram 02/2013 at [facility] 4. Hypokalemia, repleted 5. Transaminitis, likely secondary to hepatic steatosis or cirrhosis, ammonia was normal on admission, no further evaluation at this point 6. Macrocytic anemia, stable, followup as an outpatient 7. Reason acute pancreatitis secondary to alcohol abuse, resolved by imaging and lipase 8. Recent history of alcohol and opioid withdrawal as well as history of IV drug use as documented in discharge summary from [facility] earlier this month 9. Alcohol abuse, monitor for withdrawal-- no signs of withdrawal 10. History of hepatitis C   Trial increased dose of beta blocker, hopefully can avoid Cardizem given depressed EF   Continue aggressive diuresis  Cardiology consultation for further recommendations  Avoid IV narcotics  Pending studies:   Blood cultures  Urine culture  Code Status: Full code DVT prophylaxis: Lovenox Family Communication: None present  Disposition Plan: Home when improved  Brendia Sacks, MD  [facility]  Pager [phone] If 7PM-7AM, please contact night-coverage at [email or web address], password [facility] 03/13/2013, 8:32 AM  LOS: 1 day   Summary: 47 year old man who presented to the emergency department with leg swelling, abdominal swelling and pain. He was found to have atrial fibrillation with rapid ventricular response in anasarca and was referred for  admission.  Consultants:  Cardiology  Procedures:    Antibiotics:    HPI/Subjective: Cardizem was stopped overnight secondary to hypotension. Patient complains of generalized body pain this morning. Breathing okay. No chest pain. He requests pain medication.  Objective: Filed Vitals:   03/13/13 0500 03/13/13 0505 03/13/13 0600 03/13/13 0809  BP:  98/75 92/71   Pulse:  109 102   Temp:    98.2 F (36.8 C)  TempSrc:    Oral  Resp:  11 8   Height:      Weight: 103.7 kg (228 lb 9.9 oz)     SpO2:  96% 73%     Intake/Output Summary (Last 24 hours) at 03/13/13 0832 Last data filed at 03/13/13 0804  Gross per 24 hour  Intake 719.66 ml  Output   4800 ml  Net -4080.34 ml     Filed Weights   03/12/13 0856 03/13/13 0500  Weight: 95.255 kg (210 lb) 103.7 kg (228 lb 9.9 oz)    Exam:   Afebrile, heart rate 100s. Systolic blood pressure 90s.  General: Appears calm and comfortable. Nontoxic.  Eyes: Appear grossly unremarkable.  ENT: Appears grossly unremarkable.  Cardiovascular: Irregular, tachycardic, no murmur rub or gallop. No significant change in lower extremity edema.  Respiratory: Clear to auscultation bilaterally. No wheezes, rales or rhonchi. Normal respiratory effort.  Abdomen: Decreased edema in body wall. Nontender.  GU: Decreased edema of the scrotum and penis. Erythema resolved.  Psychiatric: Grossly normal mood and affect. Speech fluent and appropriate.  Data Reviewed:  Weights appeared to be grossly inaccurate  -4 L since admission  Troponin is negative  Basic metabolic panel notable for elevated CO2  Leukopenia and anemia without significant change  Scheduled Meds: . aspirin EC  81 mg Oral Daily  . docusate sodium  100 mg Oral Daily  . enoxaparin (  LOVENOX) injection  40 mg Subcutaneous Q24H  . folic acid  1 mg Oral Daily  . furosemide  40 mg Intravenous Q12H  . lisinopril  2.5 mg Oral Daily  . metoprolol tartrate  25 mg Oral BID  .  multivitamin with minerals  1 tablet Oral Daily  . pantoprazole  40 mg Oral Daily  . potassium chloride  40 mEq Oral BID  . sodium chloride  3 mL Intravenous Q12H  . thiamine  100 mg Oral Daily   Or  . thiamine  100 mg Intravenous Daily   Continuous Infusions: . diltiazem (CARDIZEM) infusion Stopped (03/13/13 0135)    Principal Problem:   Atrial fibrillation with rapid ventricular response Active Problems:   Hypokalemia   Atrial fibrillation   Anasarca   Acute systolic CHF (congestive heart failure)   Transaminitis   Macrocytic anemia   Alcohol abuse   Chronic systolic congestive heart failure   Time spent 25 minutes

## 2013-03-13 NOTE — Progress Notes (Signed)
Pt's HR noted to be increased at 130's-140's and sustaining. Pt's BP 109/70. Pt asymptomatic. Dr. Irene Limbo paged and made aware. Stated would make adjustments to metoprolol dosage. Will continue to monitor.

## 2013-03-13 NOTE — Progress Notes (Signed)
UR chart review completed.  

## 2013-03-13 NOTE — Care Management Note (Addendum)
    Page 1 of 2   03/17/2013     1:47:23 PM   CARE MANAGEMENT NOTE 03/17/2013  Patient:  Seth Newton, Seth Newton   Account Number:  [id number]  Date Initiated:  03/13/2013  Documentation initiated by:  Sharrie Rothman  Subjective/Objective Assessment:   Pt admitted from home with a fib and heart failure. Pt lives with his mother and will return home at discharge. Pt is fairly independent with ADL's.     Action/Plan:   Will help arrange PCP prior to discharge. Pt stated that [facility] was trying to arrange PCP with Dr. Juanetta Gosling at his last hospitalization last week.   Anticipated DC Date:  03/16/2013   Anticipated DC Plan:  HOME/SELF CARE      DC Planning Services  CM consult  PCP issues      [facility] Choice  HOME HEALTH   Choice offered to / List presented to:  [name]        HH arranged  HH-1 RN      [facility] agency  [facility]   Status of service:  Completed, signed off Medicare Important Message given?   (If response is "NO", the following Medicare IM given date fields will be blank) Date Medicare IM given:   Date Additional Medicare IM given:    Discharge Disposition:  HOME/SELF CARE  Per UR Regulation:    If discussed at Long Length of Stay Meetings, dates discussed:    Comments:  03/17/13 1340 Arlyss Queen, RN BSN CM Pts PCP arranged with [facility] with Dr. Otilio Miu. Pt notified of PCP followup. HH arranged with [facility]. Alroy Bailiff of [facility] is aware and will collect pts information from the chart. Pt potential discharge over the weekend. HH services to start within 48 hours of discharge. No DME needs noted. Pt and pts nurse aware of discharge arrangements.

## 2013-03-13 NOTE — Progress Notes (Signed)
Lopressor given tonight, HR continues to sporadically jump up to the 150's and back to 120's-130's, no real change as of yet.  Patient does not know how to relax, wakes up and pulls off all monitoring equipment and comes out to nurses station before bed alarm can be silenced.  Assisted back to bed.  Patient is steady when ambulating. Just impulsive.

## 2013-03-14 DIAGNOSIS — R55 Syncope and collapse: Secondary | ICD-10-CM

## 2013-03-14 DIAGNOSIS — I4891 Unspecified atrial fibrillation: Principal | ICD-10-CM

## 2013-03-14 DIAGNOSIS — I10 Essential (primary) hypertension: Secondary | ICD-10-CM

## 2013-03-14 DIAGNOSIS — I509 Heart failure, unspecified: Secondary | ICD-10-CM

## 2013-03-14 DIAGNOSIS — I5021 Acute systolic (congestive) heart failure: Secondary | ICD-10-CM

## 2013-03-14 DIAGNOSIS — F1010 Alcohol abuse, uncomplicated: Secondary | ICD-10-CM

## 2013-03-14 DIAGNOSIS — R9431 Abnormal electrocardiogram [ECG] [EKG]: Secondary | ICD-10-CM

## 2013-03-14 LAB — BASIC METABOLIC PANEL
BUN: 7 mg/dL (ref 6–23)
CO2: 38 mEq/L — ABNORMAL HIGH (ref 19–32)
Calcium: 9.3 mg/dL (ref 8.4–10.5)
GFR calc Af Amer: >90 mL/min (ref >90)
GFR calc non Af Amer: >90 mL/min (ref >90)
Potassium: 4 mEq/L (ref 3.5–5.1)

## 2013-03-14 MED ORDER — DIGOXIN 0.25 MG/ML IJ SOLN
0.5000 mg | Freq: Once | INTRAMUSCULAR | Status: AC
  Administered 2013-03-14: 0.5 mg via INTRAVENOUS
  Filled 2013-03-14: qty 2

## 2013-03-14 MED ORDER — METOPROLOL TARTRATE 50 MG PO TABS
100.0000 mg | ORAL_TABLET | Freq: Two times a day (BID) | ORAL | Status: DC
  Administered 2013-03-14 – 2013-03-15 (×4): 100 mg via ORAL
  Filled 2013-03-14 (×5): qty 2

## 2013-03-14 MED ORDER — DIGOXIN 250 MCG PO TABS
0.2500 mg | ORAL_TABLET | Freq: Four times a day (QID) | ORAL | Status: AC
  Administered 2013-03-14 – 2013-03-15 (×4): 0.25 mg via ORAL
  Filled 2013-03-14 (×4): qty 1

## 2013-03-14 NOTE — Consult Note (Signed)
CARDIOLOGY CONSULT NOTE  Patient ID: Seth Newton MRN: [id number] DOB/AGE: [birth year] 47 y.o.  Admit date: 03/12/2013 Primary Physician No PCP Per Patient  Reason for Consultation: A Fib with RVR, acute systolic heart failure  HPI: The patient is a 47 yr old male who was recently discharged from [facility] and had been treated for acute pancreatitis, aspiration pneumonitis, hypovolemic hyponatremia, alcohol and opiate withdrawal, and atrial fibrillation with RVR. An echocardiogram performed on 12/1 (in the setting of a rapid heart rate, 125 bpm) reportedly revealed severely reduced LV systolic function, EF 30%, with diffuse global hypokinesis, mild to moderate mitral and tricuspid regurgitation, mild LV dilatation, borderline right ventricular dilatation, mild biatrial enlargement, and indeterminate diastolic function. He was discharged on both metoprolol and diltiazem with bid dosing, and only ASA was given due to his ongoing alcohol use and h/o falls. His atrial fibrillation was deemed a new diagnosis for him and was attributed to his acute illnesses. He was noted to have apneic episodes during that hospitalization and was administered CPAP, and it was recommended he undergo an outpatient sleep study. He was then admitted on [facility] on 12/14 due to lower extremity and abdominal wall edema, with atrial fibrillation with RVR. He has been diuresed and he is feeling better with respect to leg swelling and breathing. He denies a h/o MI's. He uses 2-3 pillows at home to sleep but says they are for comfort due to neck pain. He does admit to PND. He is anxious.  Soc: drinks 4 beers daily, denies use of hard liquor. Denies cigarette use. FamHx: denies h/o premature CAD in 1st degree relatives.    Allergies  Allergen Reactions  . Gabapentin Other (See Comments)    Unknown   . Penicillins Rash    Current Facility-Administered Medications  Medication Dose Route  Frequency Provider Last Rate Last Dose  . 0.9 %  sodium chloride infusion  250 mL Intravenous PRN Standley Brooking, MD      . acetaminophen (TYLENOL) tablet 650 mg  650 mg Oral Q4H PRN Standley Brooking, MD   650 mg at 03/14/13 1008  . aspirin EC tablet 81 mg  81 mg Oral Daily Standley Brooking, MD   81 mg at 03/14/13 1005  . diazepam (VALIUM) tablet 10 mg  10 mg Oral Q12H PRN Standley Brooking, MD   10 mg at 03/13/13 2213  . docusate sodium (COLACE) capsule 100 mg  100 mg Oral Daily Standley Brooking, MD   100 mg at 03/12/13 1724  . enoxaparin (LOVENOX) injection 40 mg  40 mg Subcutaneous Q24H Standley Brooking, MD   40 mg at 03/13/13 1646  . folic acid (FOLVITE) tablet 1 mg  1 mg Oral Daily Standley Brooking, MD   1 mg at 03/14/13 1006  . furosemide (LASIX) injection 40 mg  40 mg Intravenous Q12H Standley Brooking, MD   40 mg at 03/14/13 0529  . lisinopril (PRINIVIL,ZESTRIL) tablet 2.5 mg  2.5 mg Oral Daily Standley Brooking, MD   2.5 mg at 03/14/13 1005  . LORazepam (ATIVAN) tablet 1 mg  1 mg Oral Q6H PRN Standley Brooking, MD   1 mg at 03/13/13 0001   Or  . LORazepam (ATIVAN) injection 1 mg  1 mg Intravenous Q6H PRN Standley Brooking, MD   1 mg at 03/14/13 0529  . metoprolol (LOPRESSOR) tablet 100 mg  100 mg Oral BID Melton Alar  Irene Limbo, MD   100 mg at 03/14/13 1005  . multivitamin with minerals tablet 1 tablet  1 tablet Oral Daily Standley Brooking, MD   1 tablet at 03/14/13 1006  . ondansetron (ZOFRAN) injection 4 mg  4 mg Intravenous Q6H PRN Standley Brooking, MD   4 mg at 03/14/13 0741  . oxyCODONE (Oxy IR/ROXICODONE) immediate release tablet 5 mg  5 mg Oral Q6H PRN Standley Brooking, MD   5 mg at 03/14/13 0529  . pantoprazole (PROTONIX) EC tablet 40 mg  40 mg Oral Daily Standley Brooking, MD   40 mg at 03/14/13 1005  . potassium chloride SA (K-DUR,KLOR-CON) CR tablet 40 mEq  40 mEq Oral BID Standley Brooking, MD   40 mEq at 03/14/13 1006  . sodium chloride 0.9 % injection 3 mL  3 mL  Intravenous Q12H Standley Brooking, MD   3 mL at 03/14/13 1009  . sodium chloride 0.9 % injection 3 mL  3 mL Intravenous PRN Standley Brooking, MD   3 mL at 03/13/13 1646  . thiamine (VITAMIN B-1) tablet 100 mg  100 mg Oral Daily Standley Brooking, MD   100 mg at 03/14/13 1006   Or  . thiamine (B-1) injection 100 mg  100 mg Intravenous Daily Standley Brooking, MD        Past Medical History  Diagnosis Date  . Seizures   . Chronic back pain   . Chronic neck pain   . Essential hypertension, benign   . A-fib   . Hepatitis C   . Chronic systolic congestive heart failure 03/12/2013  . Atrial fibrillation 03/12/2013  . Pancreatitis   . Alcohol abuse     Past Surgical History  Procedure Laterality Date  . Back surgery      neck  . Abdominal surgery      for gastric ulcers?    History   Social History  . Marital Status: Divorced    Spouse Name: N/A    Number of Children: N/A  . Years of Education: N/A   Occupational History  . Not on file.   Social History Main Topics  . Smoking status: Never Smoker   . Smokeless tobacco: Never Used  . Alcohol Use: No  . Drug Use: No  . Sexual Activity: Not on file   Other Topics Concern  . Not on file   Social History Narrative  . No narrative on file     Family History  Problem Relation Age of Onset  . Hypertension Father      Prior to Admission medications   Medication Sig Start Date End Date Taking? Authorizing Provider  aspirin EC 81 MG tablet Take 81 mg by mouth daily.   Yes Historical Provider, MD  diazepam (VALIUM) 10 MG tablet Take 10 mg by mouth 2 (two) times daily.   Yes Historical Provider, MD  diltiazem (CARDIZEM SR) 90 MG 12 hr capsule Take 180 mg by mouth every 12 (twelve) hours.   Yes Historical Provider, MD  docusate sodium (COLACE) 100 MG capsule Take 100 mg by mouth daily.   Yes Historical Provider, MD  folic acid (FOLVITE) 1 MG tablet Take 1 mg by mouth daily.   Yes Historical Provider, MD  metoprolol  tartrate (LOPRESSOR) 25 MG tablet Take by mouth 2 (two) times daily.   Yes Historical Provider, MD  Multiple Vitamin (MULTIVITAMIN WITH MINERALS) TABS tablet Take 1 tablet by mouth daily.   Yes Historical  Provider, MD  oxycodone (ROXICODONE) 30 MG immediate release tablet Take 30 mg by mouth 5 (five) times daily.   Yes Historical Provider, MD  Oxycodone HCl 10 MG TABS Take 10 mg by mouth every 6 (six) hours as needed (pain).   Yes Historical Provider, MD  oxymetazoline (AFRIN) 0.05 % nasal spray Place 2 sprays into the nose 2 (two) times daily as needed for congestion.   Yes Historical Provider, MD  oxymorphone (OPANA ER) 40 MG 12 hr tablet Take 40 mg by mouth every 12 (twelve) hours.   Yes Historical Provider, MD  pantoprazole (PROTONIX) 40 MG tablet Take 40 mg by mouth daily.   Yes Historical Provider, MD  promethazine (PHENERGAN) 25 MG tablet Take 25 mg by mouth every 6 (six) hours as needed for nausea or vomiting.   Yes Historical Provider, MD  thiamine 100 MG tablet Take 100 mg by mouth daily.   Yes Historical Provider, MD  methadone (DOLOPHINE) 5 MG tablet Take 2.5-5 mg by mouth See admin instructions. Take 5 mg on 03/07/13.  Take 2.5 mg twice daily on 03/08/2013.  Take 2.5 mg daily on 03/09/13 and 03/10/13.    Historical Provider, MD     Review of systems complete and found to be negative unless listed above in HPI     Physical exam Blood pressure 111/96, pulse 79, temperature 98.9 F (37.2 C), temperature source Oral, resp. rate 11, height 6' (1.829 m), weight 225 lb 1.4 oz (102.1 kg), SpO2 91.00%. General: NAD, slightly anxious, responds appropriately to questions. Neck: No JVD, no thyromegaly or thyroid nodule.  Lungs: Clear to auscultation bilaterally with normal respiratory effort. CV: Nondisplaced PMI.  Heart irregular, HR 140 bpm, normal S1/S2, no murmur.  Trace peripheral edema.  No carotid bruit.  Normal pedal pulses.  Abdomen: Soft, nontender, no hepatosplenomegaly, no  distention.  Skin: Intact without lesions or rashes.  Neurologic: Alert and oriented x 3.  Psych: Normal affect. Extremities: No clubbing or cyanosis.  HEENT: Normal.   Labs:   Lab Results  Component Value Date   WBC 2.8* 03/13/2013   HGB 11.5* 03/13/2013   HCT 36.7* 03/13/2013   MCV 106.1* 03/13/2013   PLT 216 03/13/2013    Recent Labs Lab 03/12/13 1000  03/14/13 0526  NA 138  < > 142  K 3.3*  < > 4.0  CL 97  < > 99  CO2 33*  < > 38*  BUN 3*  < > 7  CREATININE 0.62  < > 0.94  CALCIUM 9.2  < > 9.3  PROT 7.0  --   --   BILITOT 0.5  --   --   ALKPHOS 61  --   --   ALT 59*  --   --   AST 63*  --   --   GLUCOSE 109*  < > 99  < > = values in this interval not displayed. Lab Results  Component Value Date   TROPONINI <0.30 03/13/2013    No results found for this basename: CHOL   No results found for this basename: HDL   No results found for this basename: LDLCALC   No results found for this basename: TRIG   No results found for this basename: CHOLHDL   No results found for this basename: LDLDIRECT       EKG: atrial fibrillation, 160 bpm, nonspecific ST-T abnormality  Studies: Dg Chest 2 View  03/12/2013   CLINICAL DATA:  Worsening and peripheral edema  EXAM:  CHEST  2 VIEW  COMPARISON:  09/07/2012  FINDINGS: Cardiac shadow is mildly enlarged. No focal infiltrate or sizable effusion is seen. The bony structures are within normal limits.  IMPRESSION: Mild cardiomegaly.  No other focal abnormality is seen.   Electronically Signed   By: Alcide Clever M.D.   On: 03/12/2013 12:05   Ct Abdomen Pelvis W Contrast  03/12/2013   CLINICAL DATA:  Abdominal pain  EXAM: CT ABDOMEN AND PELVIS WITH CONTRAST  TECHNIQUE: Multidetector CT imaging of the abdomen and pelvis was performed using the standard protocol following bolus administration of intravenous contrast.  CONTRAST:  50mL OMNIPAQUE IOHEXOL 300 MG/ML SOLN, OMNIPAQUE IOHEXOL 300 MG/ML SOLN  COMPARISON:  02/24/2013   FINDINGS: The lung bases are free of acute infiltrate or sizable effusion. The previously seen changes have resolved in the interval.  The liver is diffusely fatty infiltrated. The gallbladder is well distended without focal abnormality. The spleen, adrenal glands and pancreas are unremarkable. The previously seen inflammatory changes surrounding the head of the pancreas and duodenum have resolved in the interval from the prior exam. The kidneys are within normal limits bilaterally. Delayed images demonstrate normal excretion of contrast bilaterally.  The appendix is not well visualized. No inflammatory changes to suggest appendicitis are noted. Diffuse diverticular change is noted without diverticulitis. The bladder is well distended. No pelvic mass lesion or sidewall abnormality is seen. The osseous structures show no acute abnormality. Mild soft tissue edematous changes are noted which may be related to some 3rd spacing. This is new from the prior exam.  IMPRESSION: Resolution of previously seen peripancreatic and periduodenal inflammatory changes.  New soft tissue edematous changes of the abdominal wall  Chronic changes as described.   Electronically Signed   By: Alcide Clever M.D.   On: 03/12/2013 12:00    ASSESSMENT AND PLAN: 1. Atrial fibrillation with a rapid ventricular response: He is currently on metoprolol 100 mg bid for rate control, with a recent dose increase. He is not on diltiazem due to severely depressed LV systolic function. However, HR remains in 140 bpm range with episodic returns to normal sinus rhythm. I will load him with IV and oral digoxin (renal function is normal) to see if this helps control HR with possible conversion to normal sinus rhythm. I agree with holding off on calcium channel blockers for now. However, I question the accuracy of his reported EF given that his HR was 125 bpm during the exam. His CHADS-Vasc score is 2 (heart failure, HTN) and given his ongoing alcohol use and  medication noncompliance, anticoagulants should probably be deferred for now. He is on Lovenox and ASA at present. The etiology of this may be his alcoholism, along with his other ongoing illnesses which are also secondary to alcoholism. TSH was normal at [facility]. 2. Acute systolic heart failure: the etiology of his cardiomyopathy may very well be due to his alcoholism, as it appears the atrial fibrillation was deemed a consequence of his most recent illness. However, I cannot be certain that it may not be tachycardia-mediated. In any case, he is on a beta blocker and ACEI. Carvedilol could be considered once his HR is under better control or he reverts back to sinus rhythm. In any event, he warrants an outpatient ischemic evaluation (nuclear stress test), to rule out an ischemic etiology. He is currently on IV Lasix and has diuresed nearly 9 liters since admission. He can likely be transitioned to oral diuretics but I will hold  off until 12/17. He also needs to strictly adhere to a low-sodium diet (had been eating potato chips and beef jerky earlier, and education was provided). 3. Alcoholism: likely the etiology for both his cardiomyopathy and atrial fibrillation. He needs outpatient management and follow-up. 4. HTN: reasonably controlled on current therapy.  Signed: Prentice Docker, M.D., F.A.C.C.  03/14/2013, 10:38 AM

## 2013-03-14 NOTE — Progress Notes (Signed)
Patient viewed the heart failure video, after eating potato chips and beef jerky in room.  Discussed low sodium diet with him.

## 2013-03-14 NOTE — Progress Notes (Signed)
Patient refuses to allow assistance with ambulation to toilet.

## 2013-03-14 NOTE — Progress Notes (Signed)
[facility] PROGRESS NOTE  Seth Newton ZOX:[id number] DOB: 07-05-1965 DOA: 03/12/2013 PCP: No PCP Per Patient  Assessment/Plan: 1. Atrial fibrillation with rapid ventricular response, labile with brief conversion to sinus rhythm overnight on oral metoprolol. 2. Anasarca secondary to acute heart failure: Improving. 3. Diuresis. Creatinine preserved. 4. Acute systolic congestive heart failure, left ventricular ejection fraction 30% by 2-D echocardiogram 02/2013 at [facility]; appears to be stable. 5. Transaminitis, likely secondary to hepatic steatosis or cirrhosis, ammonia was normal on admission, no further evaluation at this point 6. Macrocytic anemia, stable, followup as an outpatient 7. Reason acute pancreatitis secondary to alcohol abuse, resolved by imaging and lipase no evidence of recurrence.. 8. Recent history of alcohol and opioid withdrawal as well as history of IV drug use as documented in discharge summary from [facility] earlier this month 9. Alcohol abuse, monitor for withdrawal-- no signs of withdrawal 10. History of hepatitis C   Continue aggressive diuresis  Increase metoprolol   Cardiology consultation for further recommendations  Avoid IV narcotics  Once heart rate has been controlled we will plan transfer to telemetry  Pending studies:   Blood cultures  Code Status: Full code DVT prophylaxis: Lovenox Family Communication: None present  Disposition Plan: Home when improved  Brendia Sacks, MD  [facility]  Pager [phone] If 7PM-7AM, please contact night-coverage at [email or web address], password [facility] 03/14/2013, 7:47 AM  LOS: 2 days   Summary: 47 year old man who presented to the emergency department with leg swelling, abdominal swelling and pain. He was found to have atrial fibrillation with rapid ventricular response in anasarca and was referred for  admission.  Consultants:  Cardiology  Procedures:    Antibiotics:    HPI/Subjective: Noncompliant with diet, eating potato chips. Deferred to sinus rhythm from about 2 AM to 5 AM but now in atrial fibrillation again with heart rate up to 140s. Appears asymptomatic. No chest pain or shortness of breath. Complains of bilateral leg pain and abdominal pain. No nausea or vomiting. Eating fine. Most of breakfast tray consumed.  Objective: Filed Vitals:   03/14/13 0500 03/14/13 0517 03/14/13 0530 03/14/13 0630  BP: 114/95  112/81 88/66  Pulse: 47 41 117 105  Temp:      TempSrc:      Resp: 14  15 17   Height:      Weight: 102.1 kg (225 lb 1.4 oz)     SpO2: 92%  98% 92%    Intake/Output Summary (Last 24 hours) at 03/14/13 0747 Last data filed at 03/14/13 0653  Gross per 24 hour  Intake   1110 ml  Output   6150 ml  Net  -5040 ml     Filed Weights   03/12/13 0856 03/13/13 0500 03/14/13 0500  Weight: 95.255 kg (210 lb) 103.7 kg (228 lb 9.9 oz) 102.1 kg (225 lb 1.4 oz)    Exam:   Afebrile, heart rate labile. Overall blood pressure appears stable.  General: Appears calm, comfortable, depressed  Flat affect  Is grossly unremarkable the ENT grossly unremarkable  Cardiovascular: Irregular, no murmur, rub or gallop. 2+ bilateral lower extremity edema of the thigh, however much improved. Abdominal edema nearly resolved. Scrotal edema much improved.  Respiratory: Clear to auscultation bilaterally. No wheezes, rales or rhonchi. Normal respiratory effort.  Abdomen: Soft, nontender, nondistended  Skin no rash or induration  Musculoskeletal grossly normal  Data Reviewed:  Weights appeared to be grossly inaccurate  -8 L since admission, -5 L in the last 24 hours  Potassium normal, BUN  and creatinine normal.  Scheduled Meds: . aspirin EC  81 mg Oral Daily  . docusate sodium  100 mg Oral Daily  . enoxaparin (LOVENOX) injection  40 mg Subcutaneous Q24H  . folic acid  1  mg Oral Daily  . furosemide  40 mg Intravenous Q12H  . lisinopril  2.5 mg Oral Daily  . metoprolol tartrate  50 mg Oral BID  . multivitamin with minerals  1 tablet Oral Daily  . pantoprazole  40 mg Oral Daily  . potassium chloride  40 mEq Oral BID  . sodium chloride  3 mL Intravenous Q12H  . thiamine  100 mg Oral Daily   Or  . thiamine  100 mg Intravenous Daily   Continuous Infusions:    Principal Problem:   Atrial fibrillation with rapid ventricular response Active Problems:   Hypokalemia   Atrial fibrillation   Anasarca   Acute systolic CHF (congestive heart failure)   Transaminitis   Macrocytic anemia   Alcohol abuse   Chronic systolic congestive heart failure   Time spent 20 minutes

## 2013-03-15 DIAGNOSIS — R609 Edema, unspecified: Secondary | ICD-10-CM

## 2013-03-15 LAB — BASIC METABOLIC PANEL
CO2: 37 mEq/L — ABNORMAL HIGH (ref 19–32)
Chloride: 98 mEq/L (ref 96–112)
Creatinine, Ser: 0.91 mg/dL (ref 0.50–1.35)
GFR calc Af Amer: >90 mL/min (ref >90)
GFR calc non Af Amer: >90 mL/min (ref >90)
Potassium: 4.3 mEq/L (ref 3.5–5.1)
Sodium: 140 mEq/L (ref 135–145)

## 2013-03-15 LAB — MAGNESIUM: Magnesium: 2 mg/dL (ref 1.5–2.5)

## 2013-03-15 MED ORDER — FUROSEMIDE 20 MG PO TABS
40.0000 mg | ORAL_TABLET | Freq: Two times a day (BID) | ORAL | Status: DC
  Administered 2013-03-15 – 2013-03-17 (×4): 40 mg via ORAL
  Filled 2013-03-15 (×3): qty 2
  Filled 2013-03-15 (×2): qty 1
  Filled 2013-03-15: qty 2

## 2013-03-15 MED ORDER — AMIODARONE LOAD VIA INFUSION
150.0000 mg | Freq: Once | INTRAVENOUS | Status: AC
  Administered 2013-03-15: 150 mg via INTRAVENOUS
  Filled 2013-03-15: qty 83.34

## 2013-03-15 MED ORDER — AMIODARONE HCL IN DEXTROSE 360-4.14 MG/200ML-% IV SOLN
60.0000 mg/h | INTRAVENOUS | Status: AC
  Administered 2013-03-15 (×2): 60 mg/h via INTRAVENOUS
  Filled 2013-03-15 (×2): qty 200

## 2013-03-15 MED ORDER — LIVING BETTER WITH HEART FAILURE BOOK
Freq: Once | Status: AC
  Administered 2013-03-15: 16:00:00
  Filled 2013-03-15: qty 1

## 2013-03-15 MED ORDER — AMIODARONE HCL IN DEXTROSE 360-4.14 MG/200ML-% IV SOLN
30.0000 mg/h | INTRAVENOUS | Status: DC
  Administered 2013-03-16 (×3): 30 mg/h via INTRAVENOUS
  Filled 2013-03-15 (×3): qty 200

## 2013-03-15 MED ORDER — AMIODARONE HCL 150 MG/3ML IV SOLN: 150.0000 mg | Freq: Once | INTRAVENOUS | Status: DC

## 2013-03-15 NOTE — Progress Notes (Signed)
Briefly explained intro to CHF. Touched on topics including: daily weights, when to call MD, sodium restrictions, and fluid restrictions. "Living Better with Heart Failure" packet ordered. Told patient to look over tonight and we would review tomorrow.

## 2013-03-15 NOTE — Progress Notes (Signed)
The patient was seen and examined, and I agree with the assessment and plan as documented above, with modifications as noted below. Lasix switched to oral 40 mg bid. He appears to be compensated. Heart rate elevates with minimal activity. Has received digoxin loading as well as metoprolol 100 mg bid. I will try loading him with amiodarone to see if this helps. I would prefer to avoid calcium channel blockers, given his depressed ejection fraction (albeit I question the accuracy of the EF given that it was assessed when HR was 125 bpm).

## 2013-03-15 NOTE — Progress Notes (Signed)
Subjective:  Not feeling too well. Nonspecific  Objective:  Vital Signs in the last 24 hours: Temp:  [97.8 F (36.6 C)-98.3 F (36.8 C)] 97.8 F (36.6 C) (12/17 0400) Pulse Rate:  [51-138] 51 (12/17 0700) Resp:  [9-41] 9 (12/17 0600) BP: (102-146)/(55-123) 136/88 mmHg (12/17 0700) SpO2:  [85 %-100 %] 97 % (12/17 0700) Weight:  [210 lb 12.2 oz (95.6 kg)] 210 lb 12.2 oz (95.6 kg) (12/17 0418)  Intake/Output from previous day: 12/16 0701 - 12/17 0700 In: 1533 [P.O.:1530; I.V.:3] Out: 3400 [Urine:3400] Intake/Output from this shift: Total I/O In: -  Out: 850 [Urine:850]  Physical Exam: NECK: Without JVD, HJR, or bruit LUNGS: Decreased breath sounds with few crackles at the bases HEART: Irregular rate and rhythm, no murmur, gallop, rub, bruit, thrill, or heave EXTREMITIES: Trace of ankle edema,Without cyanosis, clubbing   Lab Results:  Recent Labs  03/12/13 1000 03/13/13 0437  WBC 3.2* 2.8*  HGB 12.8* 11.5*  PLT 223 216    Recent Labs  03/14/13 0526 03/15/13 0451  NA 142 140  K 4.0 4.3  CL 99 98  CO2 38* 37*  GLUCOSE 99 107*  BUN 7 7  CREATININE 0.94 0.91    Recent Labs  03/12/13 2344 03/13/13 0437  TROPONINI <0.30 <0.30   Hepatic Function Panel  Recent Labs  03/12/13 1000  PROT 7.0  ALBUMIN 3.4*  AST 63*  ALT 59*  ALKPHOS 61  BILITOT 0.5   No results found for this basename: CHOL,  in the last 72 hours No results found for this basename: PROTIME,  in the last 72 hours  Imaging:  Cardiac Studies:  Assessment/Plan:  1. Atrial fibrillation with a rapid ventricular response: Rated better controlled but still going up to 130's at times.He is currently on metoprolol 100 mg bid for rate control, with a recent dose increase. He is not on diltiazem due to severely depressed LV systolic function.Digoxin load given yesterday. No calcium channel blockers for now.  His CHADS-Vasc score is 2 (heart failure, HTN) and given his ongoing alcohol use and  medication noncompliance, anticoagulants should probably be deferred for now. He is on Lovenox and ASA at present. The etiology of this may be his alcoholism, along with his other ongoing illnesses which are also secondary to alcoholism. TSH was normal at [facility].  2. Acute systolic heart failure: excellent diuresis. Will change to po lasix today.left ventricular ejection fraction 30% by 2-D echocardiogram 02/2013 at [facility].The etiology of his cardiomyopathy may very well be due to his alcoholism, as it appears the atrial fibrillation was deemed a consequence of his most recent illness.  he is on a beta blocker and ACEI. Carvedilol could be considered once his HR is under better control or he reverts back to sinus rhythm.  he warrants an outpatient ischemic evaluation (nuclear stress test), to rule out an ischemic etiology.   3. Alcoholism: likely the etiology for both his cardiomyopathy and atrial fibrillation. He needs outpatient management and follow-up.  4. HTN: reasonably controlled on current therapy.  5. WBC 2.8   LOS: 3 days    Seth Newton 03/15/2013, 8:08 AM

## 2013-03-15 NOTE — Progress Notes (Signed)
[facility] PROGRESS NOTE  Seth Newton WUJ:[id number] DOB: Feb 26, 1966 DOA: 03/12/2013 PCP: No PCP Per Patient  Assessment/Plan: 1. Atrial fibrillation with rapid ventricular response, labile, currently on beta blocker which was increased yesterday as well as digoxin which was loaded yesterday 2. Anasarca secondary to acute heart failure: Resolving rapidly with aggressive diuresis. 3. Acute systolic congestive heart failure, left ventricular ejection fraction 30% by 2-D echocardiogram 02/2013 at [facility]; appears to decompensated this point with excellent diuresis. Continue beta blocker, lisinopril, further recommendations per cardiology. 4. Transaminitis, likely secondary to hepatic steatosis or cirrhosis, ammonia was normal on admission, no further evaluation at this point 5. Macrocytic anemia, stable, followup as an outpatient 6. Recent acute pancreatitis secondary to alcohol abuse, resolved by imaging and lipase no evidence of recurrence. 7. Recent history of alcohol and opioid withdrawal as well as history of IV drug use as documented in discharge summary from [facility] earlier this month 8. Alcohol abuse, monitor for withdrawal--no signs of withdrawal 9. History of hepatitis C   Heart rate control remains labile despite adjustments in beta blocker and addition of digoxin yesterday. Up to 130s during examination with no activity. However relatively asymptomatic. Followup in cardiology recommendations today.  Appears to be approaching euvolemic. Change to oral diuretics.  Avoid IV narcotics  Once heart rate has been controlled we will plan transfer to telemetry  Pending studies:   Blood cultures  Code Status: Full code DVT prophylaxis: Lovenox Family Communication: None present  Disposition Plan: Home when improved  Brendia Sacks, MD  [facility]  Pager [phone] If 7PM-7AM, please contact night-coverage at [email or web address], password [facility] 03/15/2013, 9:30 AM  LOS:  3 days   Summary: 47 year old man who presented to the emergency department with leg swelling, abdominal swelling and pain. He was found to have atrial fibrillation with rapid ventricular response in anasarca and was referred for admission.  Consultants:  Cardiology  Procedures:    Antibiotics:    HPI/Subjective: Heart rate better controlled overnight. Overall feeling better. Eating diet without difficulty. No new issues per RN. No specific complaints today.  Objective: Filed Vitals:   03/15/13 0500 03/15/13 0600 03/15/13 0700 03/15/13 0800  BP: 110/79 121/96 136/88 131/93  Pulse: 95 97 51 93  Temp:      TempSrc:      Resp: 19 9  11   Height:      Weight:      SpO2: 96% 93% 97% 97%    Intake/Output Summary (Last 24 hours) at 03/15/13 0930 Last data filed at 03/15/13 0754  Gross per 24 hour  Intake   1413 ml  Output   3525 ml  Net  -2112 ml     Filed Weights   03/13/13 0500 03/14/13 0500 03/15/13 0418  Weight: 103.7 kg (228 lb 9.9 oz) 102.1 kg (225 lb 1.4 oz) 95.6 kg (210 lb 12.2 oz)    Exam:   Afebrile, heart somewhat labile, remains in atrial fibrillation, vital signs stable  General: Appears calm and comfortable speech fluent and clear.  Cardiovascular: Regular rate, tachycardic at times, no murmur, rub or gallop. 1+ bilateral lower extremity edema, much improved with resolution of abdominal edema, near resolution of penile and scrotal edema, near resolution of thigh edema.  Respiratory: Clear to auscultation bilaterally. No wheezes, rales or rhonchi. Normal respiratory effort.  Psychiatric: Appears depressed  Eyes appear grossly unremarkable  ENT appears grossly unremarkable  Skin appears grossly unremarkable no evidence of erythema  Data Reviewed:  -10.8 L  Basic  metabolic panel unremarkable, potassium 4.3, magnesium 2.0  Scheduled Meds: . aspirin EC  81 mg Oral Daily  . digoxin  0.25 mg Oral Q6H  . docusate sodium  100 mg Oral Daily  .  enoxaparin (LOVENOX) injection  40 mg Subcutaneous Q24H  . folic acid  1 mg Oral Daily  . furosemide  40 mg Oral BID  . lisinopril  2.5 mg Oral Daily  . metoprolol tartrate  100 mg Oral BID  . multivitamin with minerals  1 tablet Oral Daily  . pantoprazole  40 mg Oral Daily  . potassium chloride  40 mEq Oral BID  . sodium chloride  3 mL Intravenous Q12H  . thiamine  100 mg Oral Daily   Or  . thiamine  100 mg Intravenous Daily   Continuous Infusions:    Principal Problem:   Atrial fibrillation with rapid ventricular response Active Problems:   Hypokalemia   Atrial fibrillation   Anasarca   Acute systolic CHF (congestive heart failure)   Transaminitis   Macrocytic anemia   Alcohol abuse   Chronic systolic congestive heart failure   Time spent 20 minutes

## 2013-03-16 LAB — BASIC METABOLIC PANEL
CO2: 30 mEq/L (ref 19–32)
Chloride: 96 mEq/L (ref 96–112)
GFR calc Af Amer: >90 mL/min (ref >90)
GFR calc non Af Amer: >90 mL/min (ref >90)
Potassium: 3.6 mEq/L (ref 3.5–5.1)
Sodium: 139 mEq/L (ref 135–145)

## 2013-03-16 MED ORDER — METOPROLOL TARTRATE 50 MG PO TABS
150.0000 mg | ORAL_TABLET | Freq: Two times a day (BID) | ORAL | Status: DC
  Administered 2013-03-16 – 2013-03-19 (×7): 150 mg via ORAL
  Filled 2013-03-16 (×7): qty 3

## 2013-03-16 NOTE — Progress Notes (Signed)
[facility] PROGRESS NOTE  Seth Newton WUJ:[id number] DOB: 1966-02-12 DOA: 03/12/2013 PCP: No PCP Per Patient  Summary: 47 year old man who presented to the emergency department with leg swelling, abdominal swelling and pain. He was found to have atrial fibrillation with rapid ventricular response in anasarca and was referred for admission. He has a history of LVEF 30% by 2-D echocardiogram done at [facility] less than 2 weeks ago. He had complex hospitalization about this, records available" care everywhere". Diuresis has been excellent and he appears to be euvolemic at this time. Main issue at this point remains atrial fibrillation with difficult rate control, currently on digoxin, beta blocker and amiodarone. Cardiology following. Anticipate discharge once heart rate has stabilized.  Assessment/Plan: 1. Atrial fibrillation with rapid ventricular response, overall rates much better but still tachycardic up to 120s on beta blocker, digoxin and amiodarone infusion  2. Anasarca secondary to acute heart failure:  resolved with aggressive diuresis.  3. Acute systolic congestive heart failure, left ventricular ejection fraction 30% by 2-D echocardiogram 02/2013 at [facility]; appears to be compensated at this point. Continue beta blocker, diuretics, lisinopril.  4. Transaminitis, likely secondary to hepatic steatosis or cirrhosis, ammonia was normal on admission, no further evaluation at this point 5. Macrocytic anemia, stable, followup as an outpatient 6. Recent acute pancreatitis secondary to alcohol abuse, resolved by imaging and lipase no evidence of recurrence. 7. Recent history of alcohol and opioid withdrawal as well as history of IV drug use as documented in discharge summary from [facility] earlier this month 8. Alcohol abuse, monitor for withdrawal--no signs of withdrawal 9. History of hepatitis C                     Rate control as per cardiology including amiodarone, digoxin, metoprolol    Avoid IV narcotics  CBC, BMP in the morning  Transfer to telemetry today heart rate better controlled  Pending studies:   Blood cultures  Code Status: Full code DVT prophylaxis: Lovenox Family Communication: None present  Disposition Plan: Home when improved  Brendia Sacks, MD  [facility]  Pager [phone] If 7PM-7AM, please contact night-coverage at [email or web address], password [email or web address] 03/16/2013, 10:00 AM  LOS: 4 days   Consultants:  Cardiology  Procedures:    Antibiotics:    HPI/Subjective: He is breathing fine. No new complaints. No chest pain. No shortness of breath. Swelling has improved.  Objective: Filed Vitals:   03/16/13 0500 03/16/13 0530 03/16/13 0600 03/16/13 0630  BP: 117/85 121/92 127/106 119/80  Pulse:      Temp:      TempSrc:      Resp: 24 18 19 13   Height:      Weight: 89 kg (196 lb 3.4 oz)     SpO2:        Intake/Output Summary (Last 24 hours) at 03/16/13 1000 Last data filed at 03/16/13 0600  Gross per 24 hour  Intake 1373.76 ml  Output   2875 ml  Net -1501.24 ml     Filed Weights   03/14/13 0500 03/15/13 0418 03/16/13 0500  Weight: 102.1 kg (225 lb 1.4 oz) 95.6 kg (210 lb 12.2 oz) 89 kg (196 lb 3.4 oz)    Exam:   Afebrile, vital signs stable, heart rate labile, tachycardic  General: Appears calm and comfortable. Speech fluent and clear.  Eyes: Appear grossly normal with normal lids, irises  ENT: Lips appear normal, hearing grossly normal  Cardiovascular: Irregular, tachycardic, no murmur, rub or gallop, near resolution of  lower remedy edema, thigh edema. Scrotal and penile edema appears resolved. Abdominal edema resolved.  Respiratory: Clear to auscultation bilaterally. No wheezes, rales or rhonchi. Normal respiratory effort.  Abdomen soft, nontender, nondistended  Musculoskeletal: Sits up without difficulty, moves all extremities  Psychiatric grossly normal mood and affect. Speech fluent and  appropriate.  Data Reviewed:  -12.2 L since admission, -2.2 in the last 24 hours  Basic metabolic panel unremarkable, potassium 3.6  Scheduled Meds: . aspirin EC  81 mg Oral Daily  . docusate sodium  100 mg Oral Daily  . enoxaparin (LOVENOX) injection  40 mg Subcutaneous Q24H  . folic acid  1 mg Oral Daily  . furosemide  40 mg Oral BID  . lisinopril  2.5 mg Oral Daily  . metoprolol tartrate  100 mg Oral BID  . multivitamin with minerals  1 tablet Oral Daily  . pantoprazole  40 mg Oral Daily  . potassium chloride  40 mEq Oral BID  . sodium chloride  3 mL Intravenous Q12H  . thiamine  100 mg Oral Daily   Or  . thiamine  100 mg Intravenous Daily   Continuous Infusions: . amiodarone (NEXTERONE PREMIX) 360 mg/200 mL dextrose 30 mg/hr (03/16/13 0600)    Principal Problem:   Atrial fibrillation with rapid ventricular response Active Problems:   Hypokalemia   Atrial fibrillation   Anasarca   Acute systolic CHF (congestive heart failure)   Transaminitis   Macrocytic anemia   Alcohol abuse   Chronic systolic congestive heart failure   Time spent 20 minutes

## 2013-03-16 NOTE — Progress Notes (Signed)
Consulting cardiologist: Prentice Docker MD  Subjective:    I feel tired and weak. No energy.   Objective:   Temp:  [97.9 F (36.6 C)-98.7 F (37.1 C)] 98.1 F (36.7 C) (12/18 0400) Pulse Rate:  [36-107] 36 (12/18 0300) Resp:  [8-27] 13 (12/18 0630) BP: (100-140)/(44-106) 119/80 mmHg (12/18 0630) SpO2:  [80 %-99 %] 91 % (12/18 0300) Weight:  [196 lb 3.4 oz (89 kg)] 196 lb 3.4 oz (89 kg) (12/18 0500) Last BM Date: 03/13/13  Filed Weights   03/14/13 0500 03/15/13 0418 03/16/13 0500  Weight: 225 lb 1.4 oz (102.1 kg) 210 lb 12.2 oz (95.6 kg) 196 lb 3.4 oz (89 kg)    Intake/Output Summary (Last 24 hours) at 03/16/13 1610 Last data filed at 03/16/13 0600  Gross per 24 hour  Intake 1373.76 ml  Output   2875 ml  Net -1501.24 ml    Telemetry: Atrial fib with rates between 80-100 bpm  Exam:  General: No acute distress.  HEENT: Conjunctiva and lids normal, oropharynx clear.  Lungs: Clear to auscultation, nonlabored.  Cardiac: No elevated JVP or bruits. IRRR, no gallop or rub.   Abdomen: Normoactive bowel sounds, nontender, nondistended.  Extremities: No pitting edema, distal pulses full.  Neuropsychiatric: Alert and oriented x3, flat affect.    Lab Results:  Basic Metabolic Panel:  Recent Labs Lab 03/14/13 0526 03/15/13 0451 03/16/13 0438  NA 142 140 139  K 4.0 4.3 3.6  CL 99 98 96  CO2 38* 37* 30  GLUCOSE 99 107* 114*  BUN 7 7 7   CREATININE 0.94 0.91 0.85  CALCIUM 9.3 9.4 9.9  MG  --  2.0  --     Liver Function Tests:  Recent Labs Lab 03/12/13 1000  AST 63*  ALT 59*  ALKPHOS 61  BILITOT 0.5  PROT 7.0  ALBUMIN 3.4*    CBC:  Recent Labs Lab 03/12/13 1000 03/13/13 0437  WBC 3.2* 2.8*  HGB 12.8* 11.5*  HCT 39.4 36.7*  MCV 102.3* 106.1*  PLT 223 216    Cardiac Enzymes:  Recent Labs Lab 03/12/13 1739 03/12/13 2344 03/13/13 0437  TROPONINI <0.30 <0.30 <0.30    BNP:  Recent Labs  03/12/13 1000  PROBNP 1190.0*      Medications:   Scheduled Medications: . aspirin EC  81 mg Oral Daily  . docusate sodium  100 mg Oral Daily  . enoxaparin (LOVENOX) injection  40 mg Subcutaneous Q24H  . folic acid  1 mg Oral Daily  . furosemide  40 mg Oral BID  . lisinopril  2.5 mg Oral Daily  . metoprolol tartrate  100 mg Oral BID  . multivitamin with minerals  1 tablet Oral Daily  . pantoprazole  40 mg Oral Daily  . potassium chloride  40 mEq Oral BID  . sodium chloride  3 mL Intravenous Q12H  . thiamine  100 mg Oral Daily   Or  . thiamine  100 mg Intravenous Daily     Infusions: . amiodarone (NEXTERONE PREMIX) 360 mg/200 mL dextrose 30 mg/hr (03/16/13 0600)     PRN Medications:  sodium chloride, acetaminophen, diazepam, ondansetron (ZOFRAN) IV, oxyCODONE, sodium chloride   Assessment and Plan:   1. Atrial fibrillation with RVR: HR remains difficult to control. He continues to have complaints of DOE, getting up to chair to the bathroom, with feelings of heart racing. He denies pain or dizziness. He has profound fatigue. He continued on amiodarone drip at 30mg  hour and metoprolol  100 mg BID.   2. Diastolic CHF:  Excellent diuresis. No evidence of fluid overload on exam. Continues on lasix 40 mg daily, with lisinopril 2.5 mg daily. Wt down 12 lbs since admission.   3.ETOH abuse:  No signs of DT's at present.   Bettey Mare. Lyman Bishop NP [facility] 03/16/2013, 8:33 AM  The patient was seen and examined, and I agree with the assessment and plan as documented above, with modifications as noted below.  The patient has received IV amiodarone loading as well as IV/po digoxin loading, and is currently on metoprolol 100 mg bid. I will increase this to 150 mg bid. I would consider adding low-dose (short-acting) diltiazem, in spite of the presumed EF of 30% (question the accuracy in the context of HR at 125 bpm during the exam), if this increase in beta blockers is not able to slow down his HR.

## 2013-03-17 DIAGNOSIS — I426 Alcoholic cardiomyopathy: Secondary | ICD-10-CM

## 2013-03-17 LAB — BASIC METABOLIC PANEL
BUN: 9 mg/dL (ref 6–23)
CO2: 30 mEq/L (ref 19–32)
Chloride: 95 mEq/L — ABNORMAL LOW (ref 96–112)
GFR calc Af Amer: >90 mL/min (ref >90)
Potassium: 4 mEq/L (ref 3.5–5.1)
Sodium: 138 mEq/L (ref 135–145)

## 2013-03-17 LAB — CBC
HCT: 47.2 % (ref 39.0–52.0)
MCHC: 33.7 g/dL (ref 30.0–36.0)
MCV: 97.7 fL (ref 78.0–100.0)
RBC: 4.83 MIL/uL (ref 4.22–5.81)
RDW: 12.5 % (ref 11.5–15.5)
WBC: 6.1 10*3/uL (ref 4.0–10.5)

## 2013-03-17 MED ORDER — DILTIAZEM HCL 30 MG PO TABS
30.0000 mg | ORAL_TABLET | Freq: Two times a day (BID) | ORAL | Status: DC
  Administered 2013-03-17 – 2013-03-18 (×3): 30 mg via ORAL
  Filled 2013-03-17 (×3): qty 1

## 2013-03-17 MED ORDER — FUROSEMIDE 40 MG PO TABS
40.0000 mg | ORAL_TABLET | Freq: Every day | ORAL | Status: DC
  Administered 2013-03-18 – 2013-03-19 (×2): 40 mg via ORAL
  Filled 2013-03-17: qty 1
  Filled 2013-03-17: qty 2

## 2013-03-17 NOTE — Progress Notes (Signed)
SUBJECTIVE: Pt says he has more energy today. Did have some nausea earlier this morning.     Intake/Output Summary (Last 24 hours) at 03/17/13 1004 Last data filed at 03/17/13 0800  Gross per 24 hour  Intake  967.4 ml  Output   3400 ml  Net -2432.6 ml    Current Facility-Administered Medications  Medication Dose Route Frequency Provider Last Rate Last Dose  . 0.9 %  sodium chloride infusion  250 mL Intravenous PRN Standley Brooking, MD      . acetaminophen (TYLENOL) tablet 650 mg  650 mg Oral Q4H PRN Standley Brooking, MD   650 mg at 03/14/13 1008  . amiodarone (NEXTERONE PREMIX) 360 mg/200 mL dextrose IV infusion  30 mg/hr Intravenous Continuous Laqueta Linden, MD 16.7 mL/hr at 03/17/13 0800 30 mg/hr at 03/17/13 0800  . aspirin EC tablet 81 mg  81 mg Oral Daily Standley Brooking, MD   81 mg at 03/16/13 1201  . diazepam (VALIUM) tablet 10 mg  10 mg Oral Q12H PRN Standley Brooking, MD   10 mg at 03/17/13 0815  . docusate sodium (COLACE) capsule 100 mg  100 mg Oral Daily Standley Brooking, MD   100 mg at 03/12/13 1724  . enoxaparin (LOVENOX) injection 40 mg  40 mg Subcutaneous Q24H Standley Brooking, MD   40 mg at 03/16/13 1802  . folic acid (FOLVITE) tablet 1 mg  1 mg Oral Daily Standley Brooking, MD   1 mg at 03/16/13 1106  . furosemide (LASIX) tablet 40 mg  40 mg Oral BID Dyann Kief, PA-C   40 mg at 03/17/13 0815  . lisinopril (PRINIVIL,ZESTRIL) tablet 2.5 mg  2.5 mg Oral Daily Standley Brooking, MD   2.5 mg at 03/16/13 1108  . metoprolol (LOPRESSOR) tablet 150 mg  150 mg Oral BID Laqueta Linden, MD   150 mg at 03/16/13 2252  . multivitamin with minerals tablet 1 tablet  1 tablet Oral Daily Standley Brooking, MD   1 tablet at 03/16/13 1107  . ondansetron (ZOFRAN) injection 4 mg  4 mg Intravenous Q6H PRN Standley Brooking, MD   4 mg at 03/17/13 0701  . oxyCODONE (Oxy IR/ROXICODONE) immediate release tablet 5 mg  5 mg Oral Q6H PRN Standley Brooking, MD   5 mg at  03/17/13 1610  . pantoprazole (PROTONIX) EC tablet 40 mg  40 mg Oral Daily Standley Brooking, MD   40 mg at 03/16/13 1107  . potassium chloride SA (K-DUR,KLOR-CON) CR tablet 40 mEq  40 mEq Oral BID Standley Brooking, MD   40 mEq at 03/16/13 2252  . sodium chloride 0.9 % injection 3 mL  3 mL Intravenous Q12H Standley Brooking, MD   3 mL at 03/16/13 2255  . sodium chloride 0.9 % injection 3 mL  3 mL Intravenous PRN Standley Brooking, MD      . thiamine (VITAMIN B-1) tablet 100 mg  100 mg Oral Daily Standley Brooking, MD   100 mg at 03/16/13 1108   Or  . thiamine (B-1) injection 100 mg  100 mg Intravenous Daily Standley Brooking, MD        Filed Vitals:   03/17/13 0530 03/17/13 0600 03/17/13 0700 03/17/13 0800  BP: 109/92 118/78 110/82   Pulse:      Temp:      TempSrc:      Resp: 13 20 13  18  Height:      Weight:      SpO2:        PHYSICAL EXAM General: NAD Neck: No JVD, no thyromegaly or thyroid nodule.  Lungs: Clear to auscultation bilaterally with normal respiratory effort. CV: Nondisplaced PMI.  Irregular rhythm, HR 90's, normal S1/S2, no murmur.  No pretibial edema.  No carotid bruit.  Normal pedal pulses.  Abdomen: Soft, nontender, no hepatosplenomegaly, no distention.  Neurologic: Alert and oriented x 3.  Psych: Normal affect. Extremities: No clubbing or cyanosis.   TELEMETRY: Reviewed telemetry pt in atrial fibrillation, HR 90's.  LABS: Basic Metabolic Panel:  Recent Labs  16/10/96 0451 03/16/13 0438 03/17/13 0431  NA 140 139 138  K 4.3 3.6 4.0  CL 98 96 95*  CO2 37* 30 30  GLUCOSE 107* 114* 109*  BUN 7 7 9   CREATININE 0.91 0.85 0.96  CALCIUM 9.4 9.9 9.8  MG 2.0  --   --    Liver Function Tests: No results found for this basename: AST, ALT, ALKPHOS, BILITOT, PROT, ALBUMIN,  in the last 72 hours No results found for this basename: LIPASE, AMYLASE,  in the last 72 hours CBC:  Recent Labs  03/17/13 0431  WBC 6.1  HGB 15.9  HCT 47.2  MCV 97.7  PLT 310     Cardiac Enzymes: No results found for this basename: CKTOTAL, CKMB, CKMBINDEX, TROPONINI,  in the last 72 hours BNP: No components found with this basename: POCBNP,  D-Dimer: No results found for this basename: DDIMER,  in the last 72 hours Hemoglobin A1C: No results found for this basename: HGBA1C,  in the last 72 hours Fasting Lipid Panel: No results found for this basename: CHOL, HDL, LDLCALC, TRIG, CHOLHDL, LDLDIRECT,  in the last 72 hours Thyroid Function Tests: No results found for this basename: TSH, T4TOTAL, FREET3, T3FREE, THYROIDAB,  in the last 72 hours Anemia Panel: No results found for this basename: VITAMINB12, FOLATE, FERRITIN, TIBC, IRON, RETICCTPCT,  in the last 72 hours  RADIOLOGY: Dg Chest 2 View  03/12/2013   CLINICAL DATA:  Worsening and peripheral edema  EXAM: CHEST  2 VIEW  COMPARISON:  09/07/2012  FINDINGS: Cardiac shadow is mildly enlarged. No focal infiltrate or sizable effusion is seen. The bony structures are within normal limits.  IMPRESSION: Mild cardiomegaly.  No other focal abnormality is seen.   Electronically Signed   By: Alcide Clever M.D.   On: 03/12/2013 12:05   Ct Abdomen Pelvis W Contrast  03/12/2013   CLINICAL DATA:  Abdominal pain  EXAM: CT ABDOMEN AND PELVIS WITH CONTRAST  TECHNIQUE: Multidetector CT imaging of the abdomen and pelvis was performed using the standard protocol following bolus administration of intravenous contrast.  CONTRAST:  50mL OMNIPAQUE IOHEXOL 300 MG/ML SOLN, OMNIPAQUE IOHEXOL 300 MG/ML SOLN  COMPARISON:  02/24/2013  FINDINGS: The lung bases are free of acute infiltrate or sizable effusion. The previously seen changes have resolved in the interval.  The liver is diffusely fatty infiltrated. The gallbladder is well distended without focal abnormality. The spleen, adrenal glands and pancreas are unremarkable. The previously seen inflammatory changes surrounding the head of the pancreas and duodenum have resolved in the  interval from the prior exam. The kidneys are within normal limits bilaterally. Delayed images demonstrate normal excretion of contrast bilaterally.  The appendix is not well visualized. No inflammatory changes to suggest appendicitis are noted. Diffuse diverticular change is noted without diverticulitis. The bladder is well distended. No pelvic  mass lesion or sidewall abnormality is seen. The osseous structures show no acute abnormality. Mild soft tissue edematous changes are noted which may be related to some 3rd spacing. This is new from the prior exam.  IMPRESSION: Resolution of previously seen peripancreatic and periduodenal inflammatory changes.  New soft tissue edematous changes of the abdominal wall  Chronic changes as described.   Electronically Signed   By: Alcide Clever M.D.   On: 03/12/2013 12:00      ASSESSMENT AND PLAN: 1. Atrial fibrillation with a rapid ventricular response: He is currently on metoprolol 150 mg bid for rate control, with a recent dose increase. He has not been on diltiazem due to severely depressed LV systolic function. However, HR remains in 90-100 bpm range at rest. I question the accuracy of his reported EF given that his HR was 125 bpm during the exam. His CHADS-Vasc score is 2 (heart failure, HTN) and given his ongoing alcohol use and medication noncompliance, anticoagulants should probably be deferred for now. He is on Lovenox and ASA at present. The etiology of this may be his alcoholism, along with his other ongoing illnesses which are also secondary to alcoholism. TSH was normal at [facility].  He has been loaded with both digoxin and amiodarone without any significant effect. I will start low-dose, short-acting diltiazem 30 mg bid. If he were to be cardioverted, the likelihood of him reverting back to atrial fibrillation would be quite high given his ongoing alcohol abuse. 2. Acute systolic heart failure: the etiology of his cardiomyopathy may very well be due to his  alcoholism, as it appears the atrial fibrillation was deemed a consequence of his most recent illness. However, I cannot be certain that it may not be tachycardia-mediated. In any case, he is on a beta blocker and ACEI. Carvedilol could be considered once his HR is under better control or he reverts back to sinus rhythm. In any event, he warrants an outpatient ischemic evaluation (nuclear stress test), to rule out an ischemic etiology.  He has diuresed signifciantly, and I will reduce Lasix to 40 mg daily . He also needs to strictly adhere to a low-sodium diet (had been eating potato chips and beef jerky earlier in the week, and education was provided).  3. Alcoholism: likely the etiology for both his cardiomyopathy and atrial fibrillation. He needs outpatient management and follow-up.  4. HTN: controlled on current therapy.    Prentice Docker, M.D., F.A.C.C.

## 2013-03-17 NOTE — Evaluation (Signed)
Physical Therapy Evaluation Patient Details Name: Seth Newton MRN: [id number] DOB: January 25, 1966 Today's Date: 03/17/2013 Time: 4098-1191 PT Time Calculation (min): 44 min  PT Assessment / Plan / Recommendation History of Present Illness  Pt is admitted with Afib and anasarca secondary to Afib.  Today, his heart rate is WNL and his edema is resolved.  He is feeling well, just weak from illness.  Clinical Impression   Pt was seen for evaluation and is close to functional baseline.  He is mildly deconditioned due to recent illness, but should be able to rebound to normal once back at home.    PT Assessment  Patent does not need any further PT services    Follow Up Recommendations  No PT follow up    Does the patient have the potential to tolerate intense rehabilitation      Barriers to Discharge        Equipment Recommendations  None recommended by PT    Recommendations for Other Services     Frequency      Precautions / Restrictions Precautions Precautions: None Restrictions Weight Bearing Restrictions: No   Pertinent Vitals/Pain       Mobility  Bed Mobility Bed Mobility: Supine to Sit;Sit to Supine Supine to Sit: 7: Independent Sit to Supine: 7: Independent Transfers Transfers: Sit to Stand;Stand to Sit Sit to Stand: 7: Independent;Without upper extremity assist;From bed Stand to Sit: 7: Independent;Without upper extremity assist;To bed Details for Transfer Assistance: Pt instructed in pausing before change of position to avoid any syncope Ambulation/Gait Ambulation/Gait Assistance: 6: Modified independent (Device/Increase time) Ambulation Distance (Feet): 300 Feet Assistive device: None Gait Pattern: Within Functional Limits Gait velocity: WNL Stairs: No Wheelchair Mobility Wheelchair Mobility: No    Exercises     PT Diagnosis:    PT Problem List:   PT Treatment Interventions:       PT Goals(Current goals can be found in the care plan  section) Acute Rehab PT Goals PT Goal Formulation: No goals set, d/c therapy  Visit Information  Last PT Received On: 03/17/13 History of Present Illness: Pt is admitted with Afib and anasarca secondary to Afib.  Today, his heart rate is WNL and his edema is resolved.  He is feeling well, just weak from illness.       Prior Functioning  Home Living Family/patient expects to be discharged to:: Private residence Living Arrangements: Parent Available Help at Discharge: Family;Available 24 hours/day Type of Home: House Home Access: Level entry Home Layout: One level Home Equipment: Walker - 2 wheels;Bedside commode Prior Function Level of Independence: Independent Communication Communication: No difficulties    Cognition  Cognition Arousal/Alertness: Awake/alert Behavior During Therapy: WFL for tasks assessed/performed Overall Cognitive Status: Within Functional Limits for tasks assessed    Extremity/Trunk Assessment Lower Extremity Assessment Lower Extremity Assessment: Overall WFL for tasks assessed   Balance Balance Balance Assessed: Yes High Level Balance High Level Balance Activites: Side stepping;Backward walking;Direction changes;Turns;Sudden stops;Head turns High Level Balance Comments: no LOB with above  End of Session PT - End of Session Equipment Utilized During Treatment: Gait belt Activity Tolerance: Patient tolerated treatment well Patient left: in bed Nurse Communication: Mobility status  GP     Seth Newton 03/17/2013, 3:17 PM

## 2013-03-17 NOTE — Progress Notes (Signed)
[facility] PROGRESS NOTE  Seth Newton NFA:[id number] DOB: [birth year] DOA: 03/12/2013 PCP: No PCP Per Patient  Summary: 47 year old man who presented to the emergency department with leg swelling, abdominal swelling and pain. He was found to have atrial fibrillation with rapid ventricular response in anasarca and was referred for admission. He has a history of LVEF 30% by 2-D echocardiogram done at [facility] less than 2 weeks ago. He had complex hospitalization about this, records available" care everywhere". Diuresis has been excellent and he appears to be euvolemic at this time. Main issue at this point remains atrial fibrillation with difficult rate control, currently on digoxin, beta blocker and amiodarone. Cardiology following. Anticipate discharge once heart rate has stabilized.  Assessment/Plan: 1. Atrial fibrillation with rapid ventricular response, Rates appear to be better controlled today.  Taken off of amiodarone infusion.  Started on oral diltiazem and continued on digoxin and BB.  Would defer anticoagulation to outpatient setting.  Discussed with cardiology.  Transfer telemetry today.  Physical therapy evaluation.  Anticipate discharge home in the next 1-2 days once heart rate stabilizes on po regimen.  2. Anasarca secondary to acute heart failure:  resolved with aggressive diuresis.  3. Acute systolic congestive heart failure, left ventricular ejection fraction 30% by 2-D echocardiogram 02/2013 at [facility]; appears to be compensated at this point. Continue beta blocker, diuretics, lisinopril. Lasix changed to PO. 4. Transaminitis, likely secondary to hepatic steatosis or cirrhosis, ammonia was normal on admission, no further evaluation at this point 5. Macrocytic anemia, stable, followup as an outpatient 6. Recent acute pancreatitis secondary to alcohol abuse, resolved by imaging and lipase no evidence of recurrence. 7. Recent history of alcohol and opioid withdrawal as well as  history of IV drug use as documented in discharge summary from [facility] earlier this month 8. Alcohol abuse, monitor for withdrawal--no signs of withdrawal 9. History of hepatitis C  Code Status: Full code DVT prophylaxis: Lovenox Family Communication: None present  Disposition Plan: Home when improved  Erick Blinks, MD  [facility]  Pager [phone] If 7PM-7AM, please contact night-coverage at [email or web address], password [facility] 03/17/2013, 11:13 AM  LOS: 5 days   Consultants:  Cardiology  Procedures:    Antibiotics:    HPI/Subjective: Feels weak and tired.  Becomes short of breath on exertion  Objective: Filed Vitals:   03/17/13 0530 03/17/13 0600 03/17/13 0700 03/17/13 0800  BP: 109/92 118/78 110/82   Pulse:      Temp:      TempSrc:      Resp: 13 20 13 18   Height:      Weight:      SpO2:        Intake/Output Summary (Last 24 hours) at 03/17/13 1113 Last data filed at 03/17/13 1000  Gross per 24 hour  Intake 1087.4 ml  Output   3700 ml  Net -2612.6 ml     Filed Weights   03/15/13 0418 03/16/13 0500 03/17/13 0400  Weight: 95.6 kg (210 lb 12.2 oz) 89 kg (196 lb 3.4 oz) 87.9 kg (193 lb 12.6 oz)    Exam:   Afebrile, vital signs stable, heart rate labile, tachycardic  General: Appears calm and comfortable. Speech fluent and clear.  Eyes: Appear grossly normal with normal lids, irises  ENT: Lips appear normal, hearing grossly normal  Cardiovascular: Irregular, tachycardic, no murmur, rub or gallop, near resolution of lower remedy edema, thigh edema. Scrotal and penile edema appears resolved. Abdominal edema resolved.  Respiratory: Clear to auscultation bilaterally. No wheezes,  rales or rhonchi. Normal respiratory effort.  Abdomen soft, nontender, nondistended  Musculoskeletal: Sits up without difficulty, moves all extremities  Psychiatric grossly normal mood and affect. Speech fluent and appropriate.   Scheduled Meds: . aspirin EC  81 mg Oral  Daily  . diltiazem  30 mg Oral Q12H  . docusate sodium  100 mg Oral Daily  . enoxaparin (LOVENOX) injection  40 mg Subcutaneous Q24H  . folic acid  1 mg Oral Daily  . [START ON 03/18/2013] furosemide  40 mg Oral Daily  . lisinopril  2.5 mg Oral Daily  . metoprolol tartrate  150 mg Oral BID  . multivitamin with minerals  1 tablet Oral Daily  . pantoprazole  40 mg Oral Daily  . potassium chloride  40 mEq Oral BID  . sodium chloride  3 mL Intravenous Q12H  . thiamine  100 mg Oral Daily   Or  . thiamine  100 mg Intravenous Daily   Continuous Infusions:    Principal Problem:   Atrial fibrillation with rapid ventricular response Active Problems:   Hypokalemia   Atrial fibrillation   Anasarca   Acute systolic CHF (congestive heart failure)   Transaminitis   Macrocytic anemia   Alcohol abuse   Chronic systolic congestive heart failure   Time spent 20 minutes

## 2013-03-18 LAB — VITAMIN B12: Vitamin B-12: 640 pg/mL (ref 211–911)

## 2013-03-18 LAB — TSH: TSH: 1.497 u[IU]/mL (ref 0.350–4.500)

## 2013-03-18 MED ORDER — POTASSIUM CHLORIDE CRYS ER 20 MEQ PO TBCR
40.0000 meq | EXTENDED_RELEASE_TABLET | Freq: Every day | ORAL | Status: DC
  Administered 2013-03-19: 40 meq via ORAL
  Filled 2013-03-18: qty 2

## 2013-03-18 MED ORDER — OXYCODONE HCL 5 MG PO TABS
5.0000 mg | ORAL_TABLET | ORAL | Status: DC | PRN
  Administered 2013-03-18 – 2013-03-19 (×8): 5 mg via ORAL
  Filled 2013-03-18 (×8): qty 1

## 2013-03-18 MED ORDER — DILTIAZEM HCL 30 MG PO TABS
30.0000 mg | ORAL_TABLET | Freq: Four times a day (QID) | ORAL | Status: DC
  Administered 2013-03-18 – 2013-03-19 (×4): 30 mg via ORAL
  Filled 2013-03-18 (×4): qty 1

## 2013-03-18 NOTE — Progress Notes (Signed)
[facility] PROGRESS NOTE  Seth Newton JXB:[id number] DOB: [birth year] DOA: 03/12/2013 PCP: No PCP Per Patient  Summary: 47 year old man who presented to the emergency department with leg swelling, abdominal swelling and pain. He was found to have atrial fibrillation with rapid ventricular response in anasarca and was referred for admission. He has a history of LVEF 30% by 2-D echocardiogram done at [facility] less than 2 weeks ago. He had complex hospitalization about this, records available" care everywhere". Diuresis has been excellent and he appears to be euvolemic at this time. Main issue at this point remains atrial fibrillation with difficult rate control, currently on digoxin, beta blocker and amiodarone. Cardiology following. Anticipate discharge once heart rate has stabilized.  Assessment/Plan: 1. Atrial fibrillation with rapid ventricular response, heart rates are still variable and are often tachycardic.  Taken off of amiodarone infusion on 12/19.  Started on oral diltiazem and continued on BB.  Would defer anticoagulation to outpatient setting.  Discussed with cardiology.  Will increase diltiazem in efforts to better control heart rate. Will discontinue lisinopril for now due to borderline blood pressures, in the efforts to further titrate heart rate meds.  2. Anasarca secondary to acute heart failure:  resolved with aggressive diuresis.  3. Acute systolic congestive heart failure, left ventricular ejection fraction 30% by 2-D echocardiogram 02/2013 at [facility]; appears to be compensated at this point. Continue beta blocker, diuretics, Lasix changed to PO. 4. Transaminitis, likely secondary to hepatic steatosis or cirrhosis, ammonia was normal on admission, no further evaluation at this point 5. Macrocytic anemia, stable, followup as an outpatient 6. Recent acute pancreatitis secondary to alcohol abuse, resolved by imaging and lipase no evidence of recurrence. 7. Recent history of  alcohol and opioid withdrawal as well as history of IV drug use as documented in discharge summary from [facility] earlier this month 8. Alcohol abuse, monitor for withdrawal--no signs of withdrawal 9. History of hepatitis C  Code Status: Full code DVT prophylaxis: Lovenox Family Communication: None present  Disposition Plan: Home when improved  Erick Blinks, MD  [facility]  Pager [phone] If 7PM-7AM, please contact night-coverage at [email or web address], password [email or web address] 03/18/2013, 11:15 AM  LOS: 6 days   Consultants:  Cardiology  Procedures:    Antibiotics:    HPI/Subjective: Feeling better today.  Still feels short of breath on exertion but overall feeling better  Objective: Filed Vitals:   03/18/13 0500 03/18/13 0600 03/18/13 0730 03/18/13 0935  BP:  114/86  98/53  Pulse:    110  Temp:      TempSrc:      Resp: 16 14 22    Height:      Weight: 91 kg (200 lb 9.9 oz)     SpO2:  95%      Intake/Output Summary (Last 24 hours) at 03/18/13 1115 Last data filed at 03/18/13 0600  Gross per 24 hour  Intake    480 ml  Output    600 ml  Net   -120 ml     Filed Weights   03/16/13 0500 03/17/13 0400 03/18/13 0500  Weight: 89 kg (196 lb 3.4 oz) 87.9 kg (193 lb 12.6 oz) 91 kg (200 lb 9.9 oz)    Exam:   Afebrile, vital signs stable, heart rate labile, tachycardic  General: Appears calm and comfortable. Speech fluent and clear.  Eyes: Appear grossly normal with normal lids, irises  ENT: Lips appear normal, hearing grossly normal  Cardiovascular: Irregular, tachycardic, no murmur, rub or gallop, near resolution of  lower remedy edema, thigh edema. Scrotal and penile edema appears resolved. Abdominal edema resolved.  Respiratory: Clear to auscultation bilaterally. No wheezes, rales or rhonchi. Normal respiratory effort.  Abdomen soft, nontender, nondistended  Musculoskeletal: Sits up without difficulty, moves all extremities  Psychiatric grossly normal mood and  affect. Speech fluent and appropriate.   Scheduled Meds: . aspirin EC  81 mg Oral Daily  . diltiazem  30 mg Oral Q6H  . docusate sodium  100 mg Oral Daily  . enoxaparin (LOVENOX) injection  40 mg Subcutaneous Q24H  . folic acid  1 mg Oral Daily  . furosemide  40 mg Oral Daily  . metoprolol tartrate  150 mg Oral BID  . multivitamin with minerals  1 tablet Oral Daily  . pantoprazole  40 mg Oral Daily  . [START ON 03/19/2013] potassium chloride  40 mEq Oral Daily  . sodium chloride  3 mL Intravenous Q12H  . thiamine  100 mg Oral Daily   Or  . thiamine  100 mg Intravenous Daily   Continuous Infusions:    Principal Problem:   Atrial fibrillation with rapid ventricular response Active Problems:   Hypokalemia   Atrial fibrillation   Anasarca   Acute systolic CHF (congestive heart failure)   Transaminitis   Macrocytic anemia   Alcohol abuse   Chronic systolic congestive heart failure   Time spent 20 minutes

## 2013-03-18 NOTE — Progress Notes (Signed)
Report called to Zannie Kehr, RN on dept 300. Pt to be transferred to room 301 via wheelchair. Seth Newton

## 2013-03-19 LAB — CREATININE, SERUM
Creatinine, Ser: 1.09 mg/dL (ref 0.50–1.35)
GFR calc non Af Amer: 78 mL/min — ABNORMAL LOW (ref >90)

## 2013-03-19 MED ORDER — OXYCODONE HCL 5 MG PO TABS: 5.0000 mg | ORAL_TABLET | ORAL | Status: DC | PRN

## 2013-03-19 MED ORDER — POTASSIUM CHLORIDE CRYS ER 20 MEQ PO TBCR: 20.0000 meq | EXTENDED_RELEASE_TABLET | Freq: Every day | ORAL | Status: DC

## 2013-03-19 MED ORDER — DILTIAZEM HCL ER COATED BEADS 180 MG PO CP24
180.0000 mg | ORAL_CAPSULE | Freq: Every day | ORAL | Status: DC
  Administered 2013-03-19: 180 mg via ORAL
  Filled 2013-03-19: qty 1

## 2013-03-19 MED ORDER — METOPROLOL TARTRATE 50 MG PO TABS: 150.0000 mg | ORAL_TABLET | Freq: Two times a day (BID) | ORAL | Status: DC

## 2013-03-19 MED ORDER — DILTIAZEM HCL ER COATED BEADS 180 MG PO CP24: 180.0000 mg | ORAL_CAPSULE | Freq: Every day | ORAL | Status: DC

## 2013-03-19 MED ORDER — FUROSEMIDE 40 MG PO TABS: 40.0000 mg | ORAL_TABLET | Freq: Every day | ORAL | Status: DC

## 2013-03-19 NOTE — Discharge Summary (Signed)
Physician Discharge Summary  Seth Newton ZOX:[id number] DOB: 1966-01-23 DOA: 03/12/2013  PCP: No PCP Per Patient  Admit date: 03/12/2013 Discharge date: 03/19/2013  Time spent: 40 minutes  Recommendations for Outpatient Follow-up:  1. Follow up with primary care doctor in 1 week as scheduled 2. Follow up with cardiology in 1-2 weeks 3. Home health has been arranged.  Discharge Diagnoses:  Principal Problem:   Atrial fibrillation with rapid ventricular response Active Problems:   Hypokalemia   Atrial fibrillation   Anasarca   Acute systolic CHF (congestive heart failure)   Transaminitis   Macrocytic anemia   Alcohol abuse   Chronic systolic congestive heart failure Chronic pain  Discharge Condition: improved  Diet recommendation: low salt  Filed Weights   03/17/13 0400 03/18/13 0500 03/19/13 0610  Weight: 87.9 kg (193 lb 12.6 oz) 91 kg (200 lb 9.9 oz) 84.596 kg (186 lb 8 oz)    History of present illness:  47 year old man who presented to the emergency department with leg swelling, abdominal swelling and pain. He was found to have atrial fibrillation with rapid ventricular response in anasarca and was referred for admission.  Patient was hospitalized at [facility] and discharged 12/9 for acute pancreatitis secondary to alcohol abuse, atrial fibrillation with rapid ventricular response, systolic heart failure with ejection fraction 30%, aspiration pneumonitis, alcohol and opioid withdrawal (according to discharge summary the patient was using Opana and oxycodone tablets IV). He was discharged on diltiazem, metoprolol, methadone taper. No diuretic.  Since discharge he has done fairly well except he has had increasing lower extremity edema that is now up to his abdomen and causes general body discomfort without focal discomfort. He came to the hospital today because of increasing lower extremity edema and generalized pain.  In the emergency Department noted to be afebrile,  tachycardic with mild hypoxia but nontoxic. Laboratory studies were notable for mild transaminitis, elevated BNP 1190, normal lactic acid, leukopenia, hemoglobin 12.8. Urinalysis was negative. CT abdomen and pelvis revealed resolution of pancreatitis. EKG not acute.   Hospital Course:  This patient was admitted to the hospital with anasarca felt to be related to acute on chronic systolic CHF.  Patient was also noted to be in rapid atrial fibrillation.  He was started on rate control and then also required amiodarone infusion.  He was followed by cardiology and cardiac meds were further adjusted.  It was found that reasonable rate control was achieved with BID lopressor and cardizem CD.  He was not started on anticoagulation at this time due to his history of noncompliance and alcohol abuse.  This will be readdressed on follow up with cardiology if patient can demonstrate compliance and reliability.  He was aggressively diuresed and has now achieved euvolemia. He is on daily lasix dosing.  For his chronic pain, he has been given a prescription for a small amount of low dose oxycodone until he can see his primary care doctor in 1 week to further address this. Patient was ambulated and maintained his heart rate under control.  He is ready for discharge home.  He has been strongly advised to comply with medications and to abstain from alcohol.  He has been set up with home health RN for disease management.  Procedures:  none  Consultations:  Cardiology  Discharge Exam: Filed Vitals:   03/19/13 0610  BP: 112/72  Pulse: 87  Temp: 98 F (36.7 C)  Resp: 20    General: NAD Cardiovascular: S1, s2 irregular Respiratory: CTA B  Discharge  Instructions  Discharge Orders   Future Appointments Provider Department Dept Phone   03/28/2013 2:30 PM Kela Millin, MD [facility] [phone]   Future Orders Complete By Expires   (HEART FAILURE PATIENTS) Call MD:  Anytime  you have any of the following symptoms: 1) 3 pound weight gain in 24 hours or 5 pounds in 1 week 2) shortness of breath, with or without a dry hacking cough 3) swelling in the hands, feet or stomach 4) if you have to sleep on extra pillows at night in order to breathe.  As directed    Diet - low sodium heart healthy  As directed    Increase activity slowly  As directed        Medication List    STOP taking these medications       diltiazem 90 MG 12 hr capsule  Commonly known as:  CARDIZEM SR     methadone 5 MG tablet  Commonly known as:  DOLOPHINE     oxymorphone 40 MG 12 hr tablet  Commonly known as:  OPANA ER      TAKE these medications       aspirin EC 81 MG tablet  Take 81 mg by mouth daily.     diazepam 10 MG tablet  Commonly known as:  VALIUM  Take 10 mg by mouth 2 (two) times daily.     diltiazem 180 MG 24 hr capsule  Commonly known as:  CARDIZEM CD  Take 1 capsule (180 mg total) by mouth daily.     docusate sodium 100 MG capsule  Commonly known as:  COLACE  Take 100 mg by mouth daily.     folic acid 1 MG tablet  Commonly known as:  FOLVITE  Take 1 mg by mouth daily.     furosemide 40 MG tablet  Commonly known as:  LASIX  Take 1 tablet (40 mg total) by mouth daily.     metoprolol 50 MG tablet  Commonly known as:  LOPRESSOR  Take 3 tablets (150 mg total) by mouth 2 (two) times daily.     multivitamin with minerals Tabs tablet  Take 1 tablet by mouth daily.     oxyCODONE 5 MG immediate release tablet  Commonly known as:  Oxy IR/ROXICODONE  Take 1 tablet (5 mg total) by mouth every 4 (four) hours as needed for severe pain.     oxymetazoline 0.05 % nasal spray  Commonly known as:  AFRIN  Place 2 sprays into the nose 2 (two) times daily as needed for congestion.     pantoprazole 40 MG tablet  Commonly known as:  PROTONIX  Take 40 mg by mouth daily.     potassium chloride SA 20 MEQ tablet  Commonly known as:  K-DUR,KLOR-CON  Take 1 tablet (20 mEq  total) by mouth daily.     promethazine 25 MG tablet  Commonly known as:  PHENERGAN  Take 25 mg by mouth every 6 (six) hours as needed for nausea or vomiting.     thiamine 100 MG tablet  Take 100 mg by mouth daily.       Allergies  Allergen Reactions  . Gabapentin Other (See Comments)    Unknown   . Penicillins Rash       Follow-up Information   Follow up with [facility].   Contact information:   [address] [place] [address] [phone]      Follow up with [facility] On 03/28/2013. (at 2:30)       Follow up with [facility].   Contact information:   [address] [place] [address] [phone]      Follow up with Laqueta Linden, MD. (call for appointment in 1-2 weeks)    Specialty:  Cardiology   Contact information:   [address] [place] [address] [phone]        The results of significant diagnostics from this hospitalization (including imaging, microbiology, ancillary and laboratory) are listed below for reference.    Significant Diagnostic Studies: Dg Chest 2 View  03/12/2013   CLINICAL DATA:  Worsening and peripheral edema  EXAM: CHEST  2 VIEW  COMPARISON:  09/07/2012  FINDINGS: Cardiac shadow is mildly enlarged. No focal infiltrate or sizable effusion is seen. The bony structures are within normal limits.  IMPRESSION: Mild cardiomegaly.  No other focal abnormality is seen.   Electronically Signed   By: Alcide Clever M.D.   On: 03/12/2013 12:05   Ct Abdomen Pelvis W Contrast  03/12/2013   CLINICAL DATA:  Abdominal pain  EXAM: CT ABDOMEN AND PELVIS WITH CONTRAST  TECHNIQUE: Multidetector CT imaging of the abdomen and pelvis was performed using the standard protocol following bolus administration of intravenous contrast.  CONTRAST:  50mL OMNIPAQUE IOHEXOL 300 MG/ML SOLN, OMNIPAQUE IOHEXOL 300 MG/ML SOLN  COMPARISON:  02/24/2013  FINDINGS: The lung bases are free of acute infiltrate or  sizable effusion. The previously seen changes have resolved in the interval.  The liver is diffusely fatty infiltrated. The gallbladder is well distended without focal abnormality. The spleen, adrenal glands and pancreas are unremarkable. The previously seen inflammatory changes surrounding the head of the pancreas and duodenum have resolved in the interval from the prior exam. The kidneys are within normal limits bilaterally. Delayed images demonstrate normal excretion of contrast bilaterally.  The appendix is not well visualized. No inflammatory changes to suggest appendicitis are noted. Diffuse diverticular change is noted without diverticulitis. The bladder is well distended. No pelvic mass lesion or sidewall abnormality is seen. The osseous structures show no acute abnormality. Mild soft tissue edematous changes are noted which may be related to some 3rd spacing. This is new from the prior exam.  IMPRESSION: Resolution of previously seen peripancreatic and periduodenal inflammatory changes.  New soft tissue edematous changes of the abdominal wall  Chronic changes as described.   Electronically Signed   By: Alcide Clever M.D.   On: 03/12/2013 12:00    Microbiology: Recent Results (from the past 240 hour(s))  CULTURE, BLOOD (ROUTINE X 2)     Status: None   Collection Time    03/12/13 10:00 AM      Result Value Range Status   Specimen Description BLOOD LEFT ANTECUBITAL   Final   Special Requests BOTTLES DRAWN AEROBIC AND ANAEROBIC 8CC   Final   Culture NO GROWTH 3 DAYS   Final   Report Status PENDING   Incomplete  URINE CULTURE     Status: None   Collection Time    03/12/13 10:21 AM      Result Value Range Status   Specimen Description URINE, CLEAN CATCH   Final   Special Requests NONE   Final   Culture  Setup Time     Final   Value: 03/12/2013 21:35     Performed at [facility] Count     Final   Value: NO GROWTH  Performed at [facility]     Final    Value: NO GROWTH     Performed at [facility]   Report Status 03/13/2013 FINAL   Final  CULTURE, BLOOD (ROUTINE X 2)     Status: None   Collection Time    03/12/13 10:46 AM      Result Value Range Status   Specimen Description BLOOD LEFT [place]   Final   Special Requests BOTTLES DRAWN AEROBIC ONLY 6CC   Final   Culture NO GROWTH 3 DAYS   Final   Report Status PENDING   Incomplete  MRSA PCR SCREENING     Status: None   Collection Time    03/12/13  8:46 PM      Result Value Range Status   MRSA by PCR NEGATIVE  NEGATIVE Final   Comment:            The GeneXpert MRSA Assay (FDA     approved for NASAL specimens     only), is one component of a     comprehensive MRSA colonization     surveillance program. It is not     intended to diagnose MRSA     infection nor to guide or     monitor treatment for     MRSA infections.     Labs: Basic Metabolic Panel:  Recent Labs Lab 03/13/13 0437 03/14/13 0526 03/15/13 0451 03/16/13 0438 03/17/13 0431 03/19/13 0548  NA 143 142 140 139 138  --   K 3.6 4.0 4.3 3.6 4.0  --   CL 101 99 98 96 95*  --   CO2 36* 38* 37* 30 30  --   GLUCOSE 94 99 107* 114* 109*  --   BUN 3* 7 7 7 9   --   CREATININE 0.82 0.94 0.91 0.85 0.96 1.09  CALCIUM 8.7 9.3 9.4 9.9 9.8  --   MG  --   --  2.0  --   --   --    Liver Function Tests: No results found for this basename: AST, ALT, ALKPHOS, BILITOT, PROT, ALBUMIN,  in the last 168 hours No results found for this basename: LIPASE, AMYLASE,  in the last 168 hours No results found for this basename: AMMONIA,  in the last 168 hours CBC:  Recent Labs Lab 03/13/13 0437 03/17/13 0431  WBC 2.8* 6.1  HGB 11.5* 15.9  HCT 36.7* 47.2  MCV 106.1* 97.7  PLT 216 310   Cardiac Enzymes:  Recent Labs Lab 03/12/13 1739 03/12/13 2344 03/13/13 0437  TROPONINI <0.30 <0.30 <0.30   BNP: BNP (last 3 results)  Recent Labs  03/12/13 1000  PROBNP 1190.0*   CBG: No results found for this basename:  GLUCAP,  in the last 168 hours     Signed:  Lamis Behrmann  [facility] 03/19/2013, 4:16 PM

## 2013-03-19 NOTE — Progress Notes (Signed)
Discharge instructions, prescriptions, and care notes given and patient and his mom verbalized understanding.  The patient left the floor via w/c with staff and family in stable condition.

## 2013-03-20 LAB — CULTURE, BLOOD (ROUTINE X 2)
Culture: NO GROWTH
Culture: NO GROWTH

## 2013-03-24 DIAGNOSIS — I4891 Unspecified atrial fibrillation: Secondary | ICD-10-CM | POA: Diagnosis not present

## 2013-03-24 DIAGNOSIS — I10 Essential (primary) hypertension: Secondary | ICD-10-CM | POA: Diagnosis not present

## 2013-03-24 DIAGNOSIS — G8929 Other chronic pain: Secondary | ICD-10-CM | POA: Diagnosis not present

## 2013-03-24 DIAGNOSIS — I5023 Acute on chronic systolic (congestive) heart failure: Secondary | ICD-10-CM | POA: Diagnosis not present

## 2013-03-24 DIAGNOSIS — I509 Heart failure, unspecified: Secondary | ICD-10-CM | POA: Diagnosis not present

## 2013-03-24 DIAGNOSIS — D649 Anemia, unspecified: Secondary | ICD-10-CM | POA: Diagnosis not present

## 2013-03-27 DIAGNOSIS — I10 Essential (primary) hypertension: Secondary | ICD-10-CM | POA: Diagnosis not present

## 2013-03-27 DIAGNOSIS — I4891 Unspecified atrial fibrillation: Secondary | ICD-10-CM | POA: Diagnosis not present

## 2013-03-27 DIAGNOSIS — G8929 Other chronic pain: Secondary | ICD-10-CM | POA: Diagnosis not present

## 2013-03-27 DIAGNOSIS — D649 Anemia, unspecified: Secondary | ICD-10-CM | POA: Diagnosis not present

## 2013-03-27 DIAGNOSIS — I5023 Acute on chronic systolic (congestive) heart failure: Secondary | ICD-10-CM | POA: Diagnosis not present

## 2013-03-27 DIAGNOSIS — I509 Heart failure, unspecified: Secondary | ICD-10-CM | POA: Diagnosis not present

## 2013-03-28 ENCOUNTER — Ambulatory Visit (INDEPENDENT_AMBULATORY_CARE_PROVIDER_SITE_OTHER): Payer: Medicare Other | Admitting: Family Medicine

## 2013-03-28 ENCOUNTER — Encounter: Payer: Self-pay | Admitting: Family Medicine

## 2013-03-28 VITALS — BP 100/78 | HR 67 | Temp 97.8°F | Resp 20 | Ht 69.0 in | Wt 187.5 lb

## 2013-03-28 DIAGNOSIS — G894 Chronic pain syndrome: Secondary | ICD-10-CM

## 2013-03-28 DIAGNOSIS — D539 Nutritional anemia, unspecified: Secondary | ICD-10-CM

## 2013-03-28 DIAGNOSIS — F1020 Alcohol dependence, uncomplicated: Secondary | ICD-10-CM

## 2013-03-28 DIAGNOSIS — I5043 Acute on chronic combined systolic (congestive) and diastolic (congestive) heart failure: Secondary | ICD-10-CM

## 2013-03-28 DIAGNOSIS — R209 Unspecified disturbances of skin sensation: Secondary | ICD-10-CM

## 2013-03-28 DIAGNOSIS — R609 Edema, unspecified: Secondary | ICD-10-CM

## 2013-03-28 DIAGNOSIS — I4891 Unspecified atrial fibrillation: Secondary | ICD-10-CM

## 2013-03-28 DIAGNOSIS — R601 Generalized edema: Secondary | ICD-10-CM

## 2013-03-28 DIAGNOSIS — I509 Heart failure, unspecified: Secondary | ICD-10-CM

## 2013-03-28 DIAGNOSIS — R202 Paresthesia of skin: Secondary | ICD-10-CM

## 2013-03-28 LAB — BASIC METABOLIC PANEL
BUN: 10 mg/dL (ref 6–23)
Calcium: 9.6 mg/dL (ref 8.4–10.5)
Chloride: 98 mEq/L (ref 96–112)
Creat: 0.85 mg/dL (ref 0.50–1.35)
Glucose, Bld: 112 mg/dL — ABNORMAL HIGH (ref 70–99)
Sodium: 136 mEq/L (ref 135–145)

## 2013-03-28 NOTE — Progress Notes (Signed)
Subjective:     Patient ID: Acquanetta Sit, male   DOB: 1966/03/27, 47 y.o.   MRN: [id number]  HPI Comments: Mr Mcgrory is a 47 y.o WM here as a new patient and is with his mother, whom he lives with.  He was initially seen in [facility] ED on 02/24/13 for abdominal pain and generalized swelling.  He was transferred to [facility] for admission.  The patient is a poor historian and doesn't appear to understand or denies many diagnoses that is stated in his chart; specifically alcohol abuse.    He was admitted on 11/28 and stayed at [facility] until 12/9. At that time, he was treated for acute pancreatitis secondary to alcohol abuse, A fib with RVR, Systolic CHF with EF of 30%, aspiration PNA, alcohol and opioid withdrawal. He was discharged on Diltiazem, Metoprolol, Methadone taper but no diuretic.  He states he didn't take the methadone because it made him feel bad. He also says that he ended up going to [facility] ED not long after his discharge from [facility] on 12/9, due to worsening swelling from his ankles up to his abdomen. At this admission, 12/14-12/21, he was also noted to be in A fib was started on Amiodarone infusion. Cardiology rounded on the patient and was weaned to Lopressor BID and Cardizem CD.  He wasn't started on anticoagulation at that time due to his hx of alcohol abuse and noncompliance. He was also given rx for oxycodone at discharge due to chronic pain. His chronic pain was in his neck and legs. He says he had surgeries on his neck and as a result, still had pain in his neck. His leg pains were described as paresthesias and needle stick sensations.    He says since he has been discharged on 12/21, he hasn't drank any alcohol. He does continue to hurt in his neck and legs. He says he will need some medicine for this, in order to live a life that has quality. He denies any opioid addiction and when asked about the hospital admissions, he denies that the alcohol was the cause of any of his  issues. He says he has been fairly healthy his whole life and is unsure why he has CHF and why he had acute pancreatitis.    Past Medical History  Diagnosis Date  . Seizures   . Chronic back pain   . Chronic neck pain   . Essential hypertension, benign   . A-fib   . Hepatitis C   . Chronic systolic congestive heart failure 03/12/2013  . Atrial fibrillation 03/12/2013  . Pancreatitis   . Alcohol abuse    Past Surgical History  Procedure Laterality Date  . Back surgery      neck  . Abdominal surgery      for gastric ulcers?   History   Social History  . Marital Status: Divorced    Spouse Name: N/A    Number of Children: 2 ages22 and 51. 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   . Years of Education: N/A   Occupational History   Social History Main Topics  . Smoking status: Never Smoker   . Smokeless tobacco: Never Used  . Alcohol Use: 3 cans of beer a week, denies any current use now  . Drug Use: None  . Sexual Activity: Not currently   Current Outpatient Prescriptions on File Prior to Visit  Medication Sig Dispense Refill  . aspirin EC 81 MG tablet  Take 81 mg by mouth daily.      . diazepam (VALIUM) 10 MG tablet Take 10 mg by mouth 2 (two) times daily.      Marland Kitchen diltiazem (CARDIZEM CD) 180 MG 24 hr capsule Take 1 capsule (180 mg total) by mouth daily.  30 capsule  1  . docusate sodium (COLACE) 100 MG capsule Take 100 mg by mouth daily.      . folic acid (FOLVITE) 1 MG tablet Take 1 mg by mouth daily.      . furosemide (LASIX) 40 MG tablet Take 1 tablet (40 mg total) by mouth daily.  30 tablet  1  . metoprolol (LOPRESSOR) 50 MG tablet Take 3 tablets (150 mg total) by mouth 2 (two) times daily.  90 tablet  1  . Multiple Vitamin (MULTIVITAMIN WITH MINERALS) TABS tablet Take 1 tablet by mouth daily.      Marland Kitchen oxyCODONE (OXY IR/ROXICODONE) 5 MG immediate release tablet Take 1 tablet (5 mg total) by mouth every 4 (four) hours as needed for severe pain.  30 tablet  0  .  oxymetazoline (AFRIN) 0.05 % nasal spray Place 2 sprays into the nose 2 (two) times daily as needed for congestion.      . pantoprazole (PROTONIX) 40 MG tablet Take 40 mg by mouth daily.      . potassium chloride SA (K-DUR,KLOR-CON) 20 MEQ tablet Take 1 tablet (20 mEq total) by mouth daily.  30 tablet  1  . promethazine (PHENERGAN) 25 MG tablet Take 25 mg by mouth every 6 (six) hours as needed for nausea or vomiting.      . thiamine 100 MG tablet Take 100 mg by mouth daily.       Family History  Problem Relation Age of Onset  . Hypertension Father     Review of Systems  Constitutional: Positive for unexpected weight change. Negative for activity change, appetite change and fatigue.       Lost 50 pounds after hospital stay where he was diuresed   HENT: Negative for congestion, facial swelling, rhinorrhea and voice change.   Eyes: Negative for visual disturbance.  Respiratory: Negative for cough, chest tightness, shortness of breath and wheezing.   Cardiovascular: Negative for chest pain, palpitations and leg swelling.  Gastrointestinal: Negative for nausea, vomiting, abdominal pain, diarrhea, constipation, blood in stool and abdominal distention.  Endocrine: Negative for polydipsia and polyuria.  Genitourinary: Negative for dysuria, hematuria, flank pain and difficulty urinating.  Musculoskeletal: Positive for neck pain. Negative for arthralgias, joint swelling, myalgias and neck stiffness.  Skin: Negative for color change and rash.  Allergic/Immunologic: Negative for environmental allergies.  Neurological: Positive for numbness. Negative for dizziness, speech difficulty, weakness and headaches.       Numbness and paresthesias in his legs  Psychiatric/Behavioral: Positive for confusion. Negative for suicidal ideas, hallucinations, behavioral problems, sleep disturbance, dysphoric mood and agitation. The patient is not nervous/anxious and is not hyperactive.        Objective:   Physical  Exam  Nursing note and vitals reviewed. Constitutional: He is oriented to person, place, and time. He appears well-developed and well-nourished.  HENT:  Head: Normocephalic and atraumatic.  Right Ear: External ear normal.  Left Ear: External ear normal.  Nose: Nose normal.  Mouth/Throat: Oropharynx is clear and moist.  Eyes: Conjunctivae are normal. Pupils are equal, round, and reactive to light.  Neck: Normal range of motion. Neck supple.  Cardiovascular: Normal rate, regular rhythm, normal heart sounds and intact distal  pulses.   Pulmonary/Chest: Effort normal and breath sounds normal.  Abdominal: Soft. Bowel sounds are normal.  Musculoskeletal: He exhibits no edema.  Cervical paraspinal tenderness to palpation. No bony prominences. Incisional scar to left anterior sternocleidomastoid.  Neurological: He is alert and oriented to person, place, and time.  Skin: Skin is warm and dry.  Psychiatric: He has a normal mood and affect.  Thought content is impaired. Doesn't appear to have knowledge on the reason behind his two hospital admissions.        Assessment:     Frank was seen today for new patient.  Diagnoses and associated orders for this visit:  Paresthesias  Anasarca - Basic metabolic panel  Macrocytic anemia  Atrial fibrillation - Basic metabolic panel  Other and unspecified alcohol dependence, unspecified drinking behavior  Chronic pain syndrome  CHF (congestive heart failure), acute on chronic, combined        Plan:     Paresthesias may be due to vitamin deficiencies.  Will check these.  Also he is on appropriate medication for his problems Will see Cardiology in 1 week. Will get BMP for now and follow up on.  Continue rate control for Afib. RRR today.  Will obtain records from [facility] in regards to chronic pain. Once I receive these, will refer to pain clinic. He understands we do not write for chronic opioids.

## 2013-03-28 NOTE — Patient Instructions (Signed)
Atrial Fibrillation  Atrial fibrillation is a type of irregular heart rhythm (arrhythmia). During atrial fibrillation, the upper chambers of the heart (atria) quiver continuously in a chaotic pattern. This causes an irregular and often rapid heart rate.   Atrial fibrillation is the result of the heart becoming overloaded with disorganized signals that tell it to beat. These signals are normally released one at a time by a part of the right atrium called the sinoatrial node. They then travel from the atria to the lower chambers of the heart (ventricles), causing the atria and ventricles to contract and pump blood as they pass. In atrial fibrillation, parts of the atria outside of the sinoatrial node also release these signals. This results in two problems. First, the atria receive so many signals that they do not have time to fully contract. Second, the ventricles, which can only receive one signal at a time, beat irregularly and out of rhythm with the atria.   There are three types of atrial fibrillation:    Paroxysmal Paroxysmal atrial fibrillation starts suddenly and stops on its own within a week.    Persistent Persistent atrial fibrillation lasts for more than a week. It may stop on its own or with treatment.    Permanent Permanent atrial fibrillation does not go away. Episodes of atrial fibrillation may lead to permanent atrial fibrillation.   Atrial fibrillation can prevent your heart from pumping blood normally. It increases your risk of stroke and can lead to heart failure.   CAUSES    Heart conditions, including a heart attack, heart failure, coronary artery disease, and heart valve conditions.    Inflammation of the sac that surrounds the heart (pericarditis).    Blockage of an artery in the lungs (pulmonary embolism).    Pneumonia or other infections.    Chronic lung disease.    Thyroid problems, especially if the thyroid is overactive (hyperthyroidism).    Caffeine, excessive alcohol  use, and use of some illegal drugs.    Use of some medications, including certain decongestants and diet pills.    Heart surgery.    Birth defects.   Sometimes, no cause can be found. When this happens, the atrial fibrillation is called lone atrial fibrillation. The risk of complications from atrial fibrillation increases if you have lone atrial fibrillation and you are age 60 years or older.  RISK FACTORS   Heart failure.   Coronary artery disease   Diabetes mellitus.    High blood pressure (hypertension).    Obesity.    Other arrhythmias.    Increased age.  SYMPTOMS    A feeling that your heart is beating rapidly or irregularly.    A feeling of discomfort or pain in your chest.    Shortness of breath.    Sudden lightheadedness or weakness.    Getting tired easily when exercising.    Urinating more often than normal (mainly when atrial fibrillation first begins).   In paroxysmal atrial fibrillation, symptoms may start and suddenly stop.  DIAGNOSIS   Your caregiver may be able to detect atrial fibrillation when taking your pulse. Usually, testing is needed to diagnosis atrial fibrillation. Tests may include:    Electrocardiography. During this test, the electrical impulses of your heart are recorded while you are lying down.    Echocardiography. During echocardiography, sound waves are used to evaluate how blood flows through your heart.    Stress test. There is more than one type of stress test. If a stress test is   needed, ask your caregiver about which type is best for you.    Chest X-ray exam.    Blood tests.    Computed tomography (CT).   TREATMENT    Treating any underlying conditions. For example, if you have an overactive thyroid, treating the condition may correct atrial fibrillation.    Medication. Medications may be given to control a rapid heart rate or to prevent blood clots, heart failure, or a stroke.    Procedure to correct the rhythm of the  heart:   Electrical cardioversion. During electrical cardioversion, a controlled, low-energy shock is delivered to the heart through your skin. If you have chest pain, very low pressure blood pressure, or sudden heart failure, this procedure may need to be done as an emergency.   Catheter ablation. During this procedure, heart tissues that send the signals that cause atrial fibrillation are destroyed.   Maze or minimaze procedure. During this surgery, thin lines of heart tissue that carry the abnormal signals are destroyed. The maze procedure is an open-heart surgery. The minimaze procedure is a minimally invasive surgery. This means that small cuts are made to access the heart instead of a large opening.   Pulmonary venous isolation. During this surgery, tissue around the veins that carry blood from the lungs (pulmonary veins) is destroyed. This tissue is thought to carry the abnormal signals.  HOME CARE INSTRUCTIONS    Take medications as directed by your caregiver.   Only take medications that your caregiver approves. Some medications can make atrial fibrillation worse or recur.   If blood thinners were prescribed by your caregiver, take them exactly as directed. Too much can cause bleeding. Too little and you will not have the needed protection against stroke and other problems.   Perform blood tests at home if directed by your caregiver.   Perform blood tests exactly as directed.    Quit smoking if you smoke.    Do not drink alcohol.    Do not drink caffeinated beverages such as coffee, soda, and some teas. You may drink decaffeinated coffee, soda, or tea.    Maintain a healthy weight. Do not use diet pills unless your caregiver approves. They may make heart problems worse.    Follow diet instructions as directed by your caregiver.    Exercise regularly as directed by your caregiver.    Keep all follow-up appointments.  PREVENTION   The following substances can cause atrial fibrillation  to recur:    Caffeinated beverages.    Alcohol.    Certain medications, especially those used for breathing problems.    Certain herbs and herbal medications, such as those containing ephedra or ginseng.   Illegal drugs such as cocaine and amphetamines.  Sometimes medications are given to prevent atrial fibrillation from recurring. Proper treatment of any underlying condition is also important in helping prevent recurrence.   SEEK MEDICAL CARE IF:   You notice a change in the rate, rhythm, or strength of your heartbeat.    You suddenly begin urinating more frequently.    You tire more easily when exerting yourself or exercising.   SEEK IMMEDIATE MEDICAL CARE IF:    You develop chest pain, abdominal pain, sweating, or weakness.   You feel sick to your stomach (nauseous).   You develop shortness of breath.   You suddenly develop swollen feet and ankles.   You feel dizzy.   You face or limbs feel numb or weak.   There is a change in your   vision or speech.  MAKE SURE YOU:    Understand these instructions.   Will watch your condition.   Will get help right away if you are not doing well or get worse.  Document Released: 03/16/2005 Document Revised: 07/11/2012 Document Reviewed: 04/26/2012  [employer] Patient Information 2014 [employer]

## 2013-03-29 ENCOUNTER — Encounter: Payer: Self-pay | Admitting: Family Medicine

## 2013-03-29 DIAGNOSIS — G8929 Other chronic pain: Secondary | ICD-10-CM | POA: Diagnosis not present

## 2013-03-29 DIAGNOSIS — F1020 Alcohol dependence, uncomplicated: Secondary | ICD-10-CM | POA: Insufficient documentation

## 2013-03-29 DIAGNOSIS — I5023 Acute on chronic systolic (congestive) heart failure: Secondary | ICD-10-CM | POA: Diagnosis not present

## 2013-03-29 DIAGNOSIS — I509 Heart failure, unspecified: Secondary | ICD-10-CM | POA: Insufficient documentation

## 2013-03-29 DIAGNOSIS — I4891 Unspecified atrial fibrillation: Secondary | ICD-10-CM | POA: Diagnosis not present

## 2013-03-29 DIAGNOSIS — D649 Anemia, unspecified: Secondary | ICD-10-CM | POA: Diagnosis not present

## 2013-03-29 DIAGNOSIS — I5030 Unspecified diastolic (congestive) heart failure: Secondary | ICD-10-CM | POA: Insufficient documentation

## 2013-03-29 DIAGNOSIS — G894 Chronic pain syndrome: Secondary | ICD-10-CM | POA: Insufficient documentation

## 2013-03-29 DIAGNOSIS — R202 Paresthesia of skin: Secondary | ICD-10-CM | POA: Insufficient documentation

## 2013-03-29 DIAGNOSIS — I10 Essential (primary) hypertension: Secondary | ICD-10-CM | POA: Diagnosis not present

## 2013-04-03 ENCOUNTER — Telehealth: Payer: Self-pay | Admitting: *Deleted

## 2013-04-03 DIAGNOSIS — G8929 Other chronic pain: Secondary | ICD-10-CM | POA: Diagnosis not present

## 2013-04-03 DIAGNOSIS — I5023 Acute on chronic systolic (congestive) heart failure: Secondary | ICD-10-CM | POA: Diagnosis not present

## 2013-04-03 DIAGNOSIS — I4891 Unspecified atrial fibrillation: Secondary | ICD-10-CM | POA: Diagnosis not present

## 2013-04-03 DIAGNOSIS — I509 Heart failure, unspecified: Secondary | ICD-10-CM | POA: Diagnosis not present

## 2013-04-03 DIAGNOSIS — D649 Anemia, unspecified: Secondary | ICD-10-CM | POA: Diagnosis not present

## 2013-04-03 DIAGNOSIS — I10 Essential (primary) hypertension: Secondary | ICD-10-CM | POA: Diagnosis not present

## 2013-04-03 NOTE — Telephone Encounter (Signed)
Angie w/ [facility] called this morning - BP 112/84 sitting / 92/68 standing.  Does c/o being lightheaded with position changes.  SOB at rest - o2 sat 98% & after walking about 70 feet - 96%.  Has never slept good & is not sleeping good now, but this is not new.  Also, c/o him having a lot of anxiety & wants to know if we can prescribe something for this as well.  Angie also questions if his Metoprolol dose should remain the same.  Patient has new post hospital scheduled with Dr. Purvis Sheffield for 04/05/2013 at 1:40.    Advised Angie that she should contact his PMD Otilio Miu) regarding anxiety medication.  Will send note to MD for advice on BP issue.

## 2013-04-03 NOTE — Telephone Encounter (Signed)
He is on the cardiac meds for atrial fibrillation and a cardiomyopathy. What is his HR?

## 2013-04-04 MED ORDER — METOPROLOL TARTRATE 50 MG PO TABS: 100.0000 mg | ORAL_TABLET | Freq: Two times a day (BID) | ORAL | Status: DC

## 2013-04-04 NOTE — Telephone Encounter (Signed)
Angie with [facility] informed.  She will notify patient.

## 2013-04-04 NOTE — Telephone Encounter (Signed)
Yesterday HR was 58.  Yesterday was first visit for Seth Newton.  HH has only had him a few days.  12/29 - HR was 64  12/26 - HR was 60 & BP 90/60 sitting   82/60 standing.

## 2013-04-04 NOTE — Telephone Encounter (Signed)
Reduce metoprolol to 100 mg bid.

## 2013-04-05 ENCOUNTER — Encounter: Payer: Medicare Other | Admitting: Cardiovascular Disease

## 2013-04-06 DIAGNOSIS — I10 Essential (primary) hypertension: Secondary | ICD-10-CM | POA: Diagnosis not present

## 2013-04-06 DIAGNOSIS — G8929 Other chronic pain: Secondary | ICD-10-CM | POA: Diagnosis not present

## 2013-04-06 DIAGNOSIS — I4891 Unspecified atrial fibrillation: Secondary | ICD-10-CM | POA: Diagnosis not present

## 2013-04-06 DIAGNOSIS — I509 Heart failure, unspecified: Secondary | ICD-10-CM | POA: Diagnosis not present

## 2013-04-06 DIAGNOSIS — D649 Anemia, unspecified: Secondary | ICD-10-CM | POA: Diagnosis not present

## 2013-04-06 DIAGNOSIS — I5023 Acute on chronic systolic (congestive) heart failure: Secondary | ICD-10-CM | POA: Diagnosis not present

## 2013-04-12 DIAGNOSIS — I509 Heart failure, unspecified: Secondary | ICD-10-CM | POA: Diagnosis not present

## 2013-04-12 DIAGNOSIS — I10 Essential (primary) hypertension: Secondary | ICD-10-CM | POA: Diagnosis not present

## 2013-04-12 DIAGNOSIS — I5023 Acute on chronic systolic (congestive) heart failure: Secondary | ICD-10-CM | POA: Diagnosis not present

## 2013-04-12 DIAGNOSIS — G8929 Other chronic pain: Secondary | ICD-10-CM | POA: Diagnosis not present

## 2013-04-12 DIAGNOSIS — I4891 Unspecified atrial fibrillation: Secondary | ICD-10-CM | POA: Diagnosis not present

## 2013-04-12 DIAGNOSIS — D649 Anemia, unspecified: Secondary | ICD-10-CM | POA: Diagnosis not present

## 2013-04-18 ENCOUNTER — Ambulatory Visit: Payer: Medicare Other | Admitting: Family Medicine

## 2013-04-19 DIAGNOSIS — I4891 Unspecified atrial fibrillation: Secondary | ICD-10-CM | POA: Diagnosis not present

## 2013-04-20 ENCOUNTER — Encounter (INDEPENDENT_AMBULATORY_CARE_PROVIDER_SITE_OTHER): Payer: Self-pay

## 2013-04-20 ENCOUNTER — Encounter: Payer: Self-pay | Admitting: *Deleted

## 2013-04-20 ENCOUNTER — Ambulatory Visit (INDEPENDENT_AMBULATORY_CARE_PROVIDER_SITE_OTHER): Payer: Medicare Other | Admitting: Cardiovascular Disease

## 2013-04-20 ENCOUNTER — Other Ambulatory Visit: Payer: Self-pay | Admitting: *Deleted

## 2013-04-20 ENCOUNTER — Encounter: Payer: Self-pay | Admitting: Cardiovascular Disease

## 2013-04-20 VITALS — BP 110/80 | HR 76 | Ht 72.0 in | Wt 193.0 lb

## 2013-04-20 DIAGNOSIS — I4891 Unspecified atrial fibrillation: Secondary | ICD-10-CM

## 2013-04-20 DIAGNOSIS — M79609 Pain in unspecified limb: Secondary | ICD-10-CM

## 2013-04-20 DIAGNOSIS — R079 Chest pain, unspecified: Secondary | ICD-10-CM

## 2013-04-20 DIAGNOSIS — F1120 Opioid dependence, uncomplicated: Secondary | ICD-10-CM

## 2013-04-20 DIAGNOSIS — I428 Other cardiomyopathies: Secondary | ICD-10-CM | POA: Diagnosis not present

## 2013-04-20 DIAGNOSIS — I10 Essential (primary) hypertension: Secondary | ICD-10-CM

## 2013-04-20 DIAGNOSIS — M79605 Pain in left leg: Secondary | ICD-10-CM

## 2013-04-20 DIAGNOSIS — R42 Dizziness and giddiness: Secondary | ICD-10-CM

## 2013-04-20 DIAGNOSIS — R9431 Abnormal electrocardiogram [ECG] [EKG]: Secondary | ICD-10-CM | POA: Diagnosis not present

## 2013-04-20 DIAGNOSIS — I429 Cardiomyopathy, unspecified: Secondary | ICD-10-CM

## 2013-04-20 DIAGNOSIS — I5022 Chronic systolic (congestive) heart failure: Secondary | ICD-10-CM

## 2013-04-20 DIAGNOSIS — M79604 Pain in right leg: Secondary | ICD-10-CM

## 2013-04-20 MED ORDER — RIVAROXABAN 20 MG PO TABS: 20.0000 mg | ORAL_TABLET | Freq: Every day | ORAL | Status: DC

## 2013-04-20 NOTE — Progress Notes (Signed)
Patient ID: Seth Newton, male   DOB: [birth year], 48 y.o.   MRN: [id number]      SUBJECTIVE: The patient is a 48 year old male who is here for post-hospitalization followup. He was hospitalized for atrial fibrillation with a rapid ventricular response and acute systolic heart failure. He also has a history of alcoholism which is deemed to be the etiology of his cardiomyopathy (vs possibly tachycardia-mediated). He also has hypertension. His CHADS-Vasc score is 2 (heart failure, HTN) but due to his ongoing alcohol use and history of medication noncompliance, anticoagulants were deferred at discharge. He also has a h/o dietary non-compliance (known to consume lots of high-sodium foods). Prior to hospitalization at [facility] for the aforementioned conditions, he was hospitalized at [facility] for alcoholic pancreatitis. He said that he now drinks approximately 2 beers 3 days out of the week. He occasionally experiences chest pain either during rest or with exertion and this lasts seconds. He occasionally has dizziness and lightheadedness shortly after standing up and has been instructed by his home health nurse to do so slowly. He denies syncope. He does feel short of breath intermittently with exertion. He denies leg swelling. 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 He requests opioids for relief of this pain, and upon review of his office visit with his PCP, he requested it at that time as well and is being referred to a pain clinic for chronic opioid management.   Allergies  Allergen Reactions  . Gabapentin Other (See Comments)    Unknown   . Penicillins Rash    Current Outpatient Prescriptions  Medication Sig Dispense Refill  . aspirin EC 81 MG tablet Take 81 mg by mouth daily.      Marland Kitchen diltiazem (CARDIZEM CD) 180 MG 24 hr capsule Take 1 capsule (180 mg total) by mouth daily.  30 capsule  1  . docusate sodium (COLACE) 100 MG capsule  Take 100 mg by mouth daily.      . folic acid (FOLVITE) 1 MG tablet Take 1 mg by mouth daily.      . furosemide (LASIX) 40 MG tablet Take 1 tablet (40 mg total) by mouth daily.  30 tablet  1  . metoprolol (LOPRESSOR) 50 MG tablet Take 2 tablets (100 mg total) by mouth 2 (two) times daily.      . Multiple Vitamin (MULTIVITAMIN WITH MINERALS) TABS tablet Take 1 tablet by mouth daily.      Marland Kitchen oxymetazoline (AFRIN) 0.05 % nasal spray Place 2 sprays into the nose 2 (two) times daily as needed for congestion.      . pantoprazole (PROTONIX) 40 MG tablet Take 40 mg by mouth daily.      . potassium chloride SA (K-DUR,KLOR-CON) 20 MEQ tablet Take 1 tablet (20 mEq total) by mouth daily.  30 tablet  1  . promethazine (PHENERGAN) 25 MG tablet Take 25 mg by mouth every 6 (six) hours as needed for nausea or vomiting.      . thiamine 100 MG tablet Take 100 mg by mouth daily.       No current facility-administered medications for this visit.    Past Medical History  Diagnosis Date  . Seizures   . Chronic back pain   . Chronic neck pain   . Essential hypertension, benign   . A-fib   . Hepatitis C   . Chronic systolic congestive heart failure 03/12/2013  . Atrial fibrillation 03/12/2013  .  Pancreatitis   . Alcohol abuse     Past Surgical History  Procedure Laterality Date  . Back surgery      neck  . Abdominal surgery      for gastric ulcers?    History   Social History  . Marital Status: Divorced    Spouse Name: N/A    Number of Children: N/A  . Years of Education: N/A   Occupational History  . Not on file.   Social History Main Topics  . Smoking status: Never Smoker   . Smokeless tobacco: Never Used  . Alcohol Use: No  . Drug Use: No  . Sexual Activity: Not on file   Other Topics Concern  . Not on file   Social History Narrative  . No narrative on file     Filed Vitals:   04/20/13 0850 04/20/13 0858  BP: 119/81 110/80  Pulse: 76   Height: 6' (1.829 m)   Weight: 193 lb  (87.544 kg)     PHYSICAL EXAM General: NAD Neck: No JVD, no thyromegaly or thyroid nodule.  Lungs: Clear to auscultation bilaterally with normal respiratory effort. CV: Nondisplaced PMI.  Heart irregular, rate-controlled, normal S1/S2, no murmur.  No peripheral edema.  No carotid bruit.  Normal pedal pulses.  Abdomen: Soft, nontender, no hepatosplenomegaly, no distention.  Neurologic: Alert and oriented x 3.  Psych: Normal affect. Extremities: No clubbing or cyanosis.   ECG: reviewed and available in electronic records. (atrial fibrillation, 76 bpm, nonspecific T wave abnormality).      ASSESSMENT AND PLAN: 1. Persistent atrial fibrillation: HR is controlled at rest. Will obtain a 24-hour Holter monitor to make certain that it is controlled with exertion as well, to see if further adjustments with beta blockers and/or calcium channel blockers need to be made. He is not in heart failure at present. No plans for cardioversion at present. Will initiate anticoagulation with Xarelto 20 mg daily and enroll in anticoagulation clinic. 2. Chronic systolic heart failure/cardiomyopathy: obtain a nuclear stress test to rule out ischemia as a potential etiology. Continue metoprolol and Lasix. Given his episodic lightheadedness and dizziness (orthostasis), will hold off on addition of ACEI or spironolactone at present. Previous EF of 30% was assessed when patient was tachycardic, 125 bpm, thus I question the accuracy of it. Had been 60-65% in 08/2012. Currently euvolemic. Will assess HR with Holter as mentioned above so as to avoid decompensation. Will need repeat echo in future. 3. HTN: controlled on current therapy. 4. Chronic leg pain: he will have to f/u with pain clinic, as I made it clear that I will not be prescribing chronic opioids but will strictly be managing his cardiovascular issues.  Time spent: 40 minutes.  Dispo: f/u 6 weeks.  Prentice DockerSuresh Koneswaran, M.D., F.A.C.C.

## 2013-04-20 NOTE — Patient Instructions (Signed)
   Begin Xarelto 20mg  daily with the evening meal   2 written script provided for medication above - 1 for 30 tabs with no refill to be used with free trial offer card & 1 for 30 tabs with 6 refills to be used with $5 per month card  Continue all other medications.   Your physician has requested that you have a lexiscan myoview. For further information please visit [email or web address] Please follow instruction sheet, as given. Your physician has recommended that you wear a 24 hour event monitor. Event monitors are medical devices that record the heart's electrical activity. Doctors most often [place] these monitors to diagnose arrhythmias. Arrhythmias are problems with the speed or rhythm of the heartbeat. The monitor is a small, portable device. You can wear one while you do your normal daily activities. This is usually used to diagnose what is causing palpitations/syncope (passing out). Office will contact with results via phone or letter.    Referral to our anticoagulation clinic for follow up visit for beginning Xarelto Follow up in  6 weeks

## 2013-04-21 ENCOUNTER — Telehealth: Payer: Self-pay | Admitting: Cardiovascular Disease

## 2013-04-21 NOTE — Telephone Encounter (Signed)
HAS QUESTION ABOUT RX

## 2013-04-21 NOTE — Telephone Encounter (Signed)
Left message to return call 

## 2013-04-25 ENCOUNTER — Ambulatory Visit: Payer: Medicare Other | Admitting: Family Medicine

## 2013-04-25 NOTE — Telephone Encounter (Signed)
Stated they had come by office to request another sample bottle of Xarelto because they didn't think they would have enough till they got their own.  Spoke with mother this morning, got medication this morning - no problems.

## 2013-04-26 ENCOUNTER — Encounter (HOSPITAL_COMMUNITY)
Admission: RE | Admit: 2013-04-26 | Discharge: 2013-04-26 | Disposition: A | Discharge status: Home / Self Care | Payer: Medicare Other | Source: Ambulatory Visit | Attending: Cardiovascular Disease | Admitting: Cardiovascular Disease

## 2013-04-26 ENCOUNTER — Encounter (HOSPITAL_COMMUNITY): Payer: Self-pay

## 2013-04-26 DIAGNOSIS — I429 Cardiomyopathy, unspecified: Secondary | ICD-10-CM

## 2013-04-26 DIAGNOSIS — I428 Other cardiomyopathies: Secondary | ICD-10-CM | POA: Diagnosis not present

## 2013-04-26 DIAGNOSIS — I4891 Unspecified atrial fibrillation: Secondary | ICD-10-CM

## 2013-04-26 DIAGNOSIS — R079 Chest pain, unspecified: Secondary | ICD-10-CM | POA: Diagnosis not present

## 2013-04-26 DIAGNOSIS — R9431 Abnormal electrocardiogram [ECG] [EKG]: Secondary | ICD-10-CM

## 2013-04-26 DIAGNOSIS — R55 Syncope and collapse: Secondary | ICD-10-CM | POA: Diagnosis not present

## 2013-04-26 MED ORDER — SODIUM CHLORIDE 0.9 % IJ SOLN
INTRAMUSCULAR | Status: AC
  Administered 2013-04-26: 10 mL via INTRAVENOUS
  Filled 2013-04-26: qty 10

## 2013-04-26 MED ORDER — TECHNETIUM TC 99M SESTAMIBI - CARDIOLITE
30.0000 | Freq: Once | INTRAVENOUS | Status: AC | PRN
  Administered 2013-04-26: 12:00:00 30 via INTRAVENOUS

## 2013-04-26 MED ORDER — TECHNETIUM TC 99M SESTAMIBI - CARDIOLITE
10.0000 | Freq: Once | INTRAVENOUS | Status: AC | PRN
  Administered 2013-04-26: 10:00:00 10 via INTRAVENOUS

## 2013-04-26 MED ORDER — REGADENOSON 0.4 MG/5ML IV SOLN
INTRAVENOUS | Status: AC
  Administered 2013-04-26: 0.4 mg via INTRAVENOUS
  Filled 2013-04-26: qty 5

## 2013-04-26 NOTE — Progress Notes (Signed)
Stress Lab Nurses Notes - Traylen Hartshorn [facility] 04/26/2013 Reason for doing test: Chest Pain, Syncope and dizziness Type of test: Marlane Hatcher Nurse performing test: Parke Poisson, RN Nuclear Medicine Tech: Lyndel Pleasure Echo Tech: Not Applicable MD performing test: Dr. Maris Berger PA Family MD: Dr. Lodema Hong Test explained and consent signed: yes IV started: 22g jelco, Saline lock flushed, No redness or edema and Saline lock started in radiology Symptoms: Nausea & dizziness Treatment/Intervention: None Reason test stopped: protocol completed After recovery IV was: Discontinued via X-ray tech and No redness or edema Patient to return to Nuc. Med at : 12:15 Patient discharged: Home Patient's Condition upon discharge was: stable Comments: During test BP 128/68 & HR 88. Recovery BP 90/66 & HR 78.  Symptoms resolved in recovery. Erskine Speed T

## 2013-04-27 ENCOUNTER — Encounter: Payer: Self-pay | Admitting: *Deleted

## 2013-05-01 DIAGNOSIS — I4891 Unspecified atrial fibrillation: Secondary | ICD-10-CM | POA: Diagnosis not present

## 2013-05-01 DIAGNOSIS — G8929 Other chronic pain: Secondary | ICD-10-CM | POA: Diagnosis not present

## 2013-05-01 DIAGNOSIS — I509 Heart failure, unspecified: Secondary | ICD-10-CM | POA: Diagnosis not present

## 2013-05-01 DIAGNOSIS — I5023 Acute on chronic systolic (congestive) heart failure: Secondary | ICD-10-CM | POA: Diagnosis not present

## 2013-05-01 DIAGNOSIS — D649 Anemia, unspecified: Secondary | ICD-10-CM | POA: Diagnosis not present

## 2013-05-01 DIAGNOSIS — I10 Essential (primary) hypertension: Secondary | ICD-10-CM | POA: Diagnosis not present

## 2013-05-02 ENCOUNTER — Encounter: Payer: Self-pay | Admitting: Family Medicine

## 2013-05-02 ENCOUNTER — Ambulatory Visit (INDEPENDENT_AMBULATORY_CARE_PROVIDER_SITE_OTHER): Payer: Medicare Other | Admitting: Family Medicine

## 2013-05-02 VITALS — BP 96/68 | HR 72 | Temp 97.4°F | Resp 20 | Ht 69.0 in | Wt 193.2 lb

## 2013-05-02 DIAGNOSIS — M25561 Pain in right knee: Secondary | ICD-10-CM

## 2013-05-02 DIAGNOSIS — Z8261 Family history of arthritis: Secondary | ICD-10-CM | POA: Diagnosis not present

## 2013-05-02 DIAGNOSIS — M2550 Pain in unspecified joint: Secondary | ICD-10-CM | POA: Diagnosis not present

## 2013-05-02 DIAGNOSIS — I5030 Unspecified diastolic (congestive) heart failure: Secondary | ICD-10-CM

## 2013-05-02 DIAGNOSIS — M25572 Pain in left ankle and joints of left foot: Secondary | ICD-10-CM

## 2013-05-02 DIAGNOSIS — M25569 Pain in unspecified knee: Secondary | ICD-10-CM

## 2013-05-02 DIAGNOSIS — G40909 Epilepsy, unspecified, not intractable, without status epilepticus: Secondary | ICD-10-CM

## 2013-05-02 DIAGNOSIS — M25562 Pain in left knee: Secondary | ICD-10-CM

## 2013-05-02 DIAGNOSIS — R7401 Elevation of levels of liver transaminase levels: Secondary | ICD-10-CM

## 2013-05-02 DIAGNOSIS — I4891 Unspecified atrial fibrillation: Secondary | ICD-10-CM

## 2013-05-02 DIAGNOSIS — M542 Cervicalgia: Secondary | ICD-10-CM

## 2013-05-02 DIAGNOSIS — M25579 Pain in unspecified ankle and joints of unspecified foot: Secondary | ICD-10-CM | POA: Diagnosis not present

## 2013-05-02 DIAGNOSIS — R110 Nausea: Secondary | ICD-10-CM

## 2013-05-02 DIAGNOSIS — G894 Chronic pain syndrome: Secondary | ICD-10-CM

## 2013-05-02 DIAGNOSIS — R7402 Elevation of levels of lactic acid dehydrogenase (LDH): Secondary | ICD-10-CM

## 2013-05-02 DIAGNOSIS — M25519 Pain in unspecified shoulder: Secondary | ICD-10-CM

## 2013-05-02 DIAGNOSIS — G8929 Other chronic pain: Secondary | ICD-10-CM

## 2013-05-02 DIAGNOSIS — R740 Nonspecific elevation of levels of transaminase and lactic acid dehydrogenase [LDH]: Secondary | ICD-10-CM

## 2013-05-02 DIAGNOSIS — M25512 Pain in left shoulder: Secondary | ICD-10-CM

## 2013-05-02 DIAGNOSIS — I509 Heart failure, unspecified: Secondary | ICD-10-CM

## 2013-05-02 MED ORDER — ONDANSETRON HCL 4 MG PO TABS: 4.0000 mg | ORAL_TABLET | Freq: Three times a day (TID) | ORAL | Status: AC | PRN

## 2013-05-02 NOTE — Patient Instructions (Signed)

## 2013-05-03 ENCOUNTER — Telehealth: Payer: Self-pay | Admitting: *Deleted

## 2013-05-03 DIAGNOSIS — Z8261 Family history of arthritis: Secondary | ICD-10-CM | POA: Insufficient documentation

## 2013-05-03 DIAGNOSIS — M2550 Pain in unspecified joint: Secondary | ICD-10-CM | POA: Insufficient documentation

## 2013-05-03 DIAGNOSIS — M25561 Pain in right knee: Secondary | ICD-10-CM | POA: Insufficient documentation

## 2013-05-03 DIAGNOSIS — G8929 Other chronic pain: Secondary | ICD-10-CM | POA: Insufficient documentation

## 2013-05-03 DIAGNOSIS — M542 Cervicalgia: Secondary | ICD-10-CM

## 2013-05-03 DIAGNOSIS — M25562 Pain in left knee: Secondary | ICD-10-CM

## 2013-05-03 DIAGNOSIS — M25572 Pain in left ankle and joints of left foot: Secondary | ICD-10-CM

## 2013-05-03 DIAGNOSIS — M25512 Pain in left shoulder: Secondary | ICD-10-CM

## 2013-05-03 DIAGNOSIS — R110 Nausea: Secondary | ICD-10-CM | POA: Insufficient documentation

## 2013-05-03 NOTE — Telephone Encounter (Signed)
Jeanette from [facility] called and left VM stating that she needed to speak with MD concerning pt plan of care and some outstanding orders. Will route to MD.

## 2013-05-03 NOTE — Telephone Encounter (Signed)
Spoke to [place] from [facility] regarding some orders that I never received and they are needing back. She is getting them faxed to me today.
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
## 2013-05-05 ENCOUNTER — Other Ambulatory Visit: Payer: Self-pay | Admitting: Cardiology

## 2013-05-05 ENCOUNTER — Telehealth: Payer: Self-pay | Admitting: Cardiovascular Disease

## 2013-05-05 MED ORDER — POTASSIUM CHLORIDE CRYS ER 20 MEQ PO TBCR: 20.0000 meq | EXTENDED_RELEASE_TABLET | Freq: Every day | ORAL | Status: DC

## 2013-05-05 MED ORDER — FUROSEMIDE 40 MG PO TABS: 40.0000 mg | ORAL_TABLET | Freq: Every day | ORAL | Status: DC

## 2013-05-05 MED ORDER — DILTIAZEM HCL ER COATED BEADS 180 MG PO CP24: 180.0000 mg | ORAL_CAPSULE | Freq: Every day | ORAL | Status: DC

## 2013-05-05 MED ORDER — METOPROLOL TARTRATE 50 MG PO TABS: 100.0000 mg | ORAL_TABLET | Freq: Two times a day (BID) | ORAL | Status: DC

## 2013-05-15 ENCOUNTER — Telehealth: Payer: Self-pay | Admitting: Cardiology

## 2013-05-15 NOTE — Telephone Encounter (Signed)
Informed pt mother of monitor results. Informed pt mother to call office Wednesday after 10 if pt had any questions. She verbalized understanding.

## 2013-05-17 ENCOUNTER — Encounter: Payer: Self-pay | Admitting: Cardiovascular Disease

## 2013-05-17 DIAGNOSIS — J811 Chronic pulmonary edema: Secondary | ICD-10-CM | POA: Diagnosis not present

## 2013-05-17 DIAGNOSIS — I4891 Unspecified atrial fibrillation: Secondary | ICD-10-CM | POA: Diagnosis not present

## 2013-05-17 DIAGNOSIS — E876 Hypokalemia: Secondary | ICD-10-CM | POA: Diagnosis not present

## 2013-05-17 DIAGNOSIS — R918 Other nonspecific abnormal finding of lung field: Secondary | ICD-10-CM | POA: Diagnosis not present

## 2013-05-17 DIAGNOSIS — I499 Cardiac arrhythmia, unspecified: Secondary | ICD-10-CM | POA: Diagnosis not present

## 2013-05-17 DIAGNOSIS — E871 Hypo-osmolality and hyponatremia: Secondary | ICD-10-CM | POA: Diagnosis not present

## 2013-05-17 DIAGNOSIS — R69 Illness, unspecified: Secondary | ICD-10-CM | POA: Diagnosis not present

## 2013-05-18 DIAGNOSIS — E871 Hypo-osmolality and hyponatremia: Secondary | ICD-10-CM | POA: Diagnosis not present

## 2013-05-18 DIAGNOSIS — I499 Cardiac arrhythmia, unspecified: Secondary | ICD-10-CM | POA: Diagnosis not present

## 2013-05-19 DIAGNOSIS — I499 Cardiac arrhythmia, unspecified: Secondary | ICD-10-CM | POA: Diagnosis not present

## 2013-05-19 DIAGNOSIS — E871 Hypo-osmolality and hyponatremia: Secondary | ICD-10-CM | POA: Diagnosis not present

## 2013-05-20 DIAGNOSIS — I499 Cardiac arrhythmia, unspecified: Secondary | ICD-10-CM | POA: Diagnosis not present

## 2013-05-20 DIAGNOSIS — E871 Hypo-osmolality and hyponatremia: Secondary | ICD-10-CM | POA: Diagnosis not present

## 2013-05-29 ENCOUNTER — Encounter: Payer: Self-pay | Admitting: Cardiovascular Disease

## 2013-05-29 ENCOUNTER — Ambulatory Visit (INDEPENDENT_AMBULATORY_CARE_PROVIDER_SITE_OTHER): Payer: Medicare Other | Admitting: Cardiovascular Disease

## 2013-05-29 VITALS — BP 105/75 | HR 82 | Ht 72.0 in | Wt 193.0 lb

## 2013-05-29 DIAGNOSIS — G479 Sleep disorder, unspecified: Secondary | ICD-10-CM

## 2013-05-29 DIAGNOSIS — Z79899 Other long term (current) drug therapy: Secondary | ICD-10-CM | POA: Diagnosis not present

## 2013-05-29 DIAGNOSIS — I428 Other cardiomyopathies: Secondary | ICD-10-CM

## 2013-05-29 DIAGNOSIS — F29 Unspecified psychosis not due to a substance or known physiological condition: Secondary | ICD-10-CM | POA: Diagnosis not present

## 2013-05-29 DIAGNOSIS — I4891 Unspecified atrial fibrillation: Secondary | ICD-10-CM | POA: Diagnosis not present

## 2013-05-29 DIAGNOSIS — E876 Hypokalemia: Secondary | ICD-10-CM

## 2013-05-29 DIAGNOSIS — I5022 Chronic systolic (congestive) heart failure: Secondary | ICD-10-CM

## 2013-05-29 DIAGNOSIS — I429 Cardiomyopathy, unspecified: Secondary | ICD-10-CM

## 2013-05-29 DIAGNOSIS — I10 Essential (primary) hypertension: Secondary | ICD-10-CM

## 2013-05-29 DIAGNOSIS — R42 Dizziness and giddiness: Secondary | ICD-10-CM

## 2013-05-29 DIAGNOSIS — R410 Disorientation, unspecified: Secondary | ICD-10-CM

## 2013-05-29 MED ORDER — ZOLPIDEM TARTRATE 5 MG PO TABS: 5.0000 mg | ORAL_TABLET | Freq: Every evening | ORAL | Status: AC | PRN

## 2013-05-29 MED ORDER — DILTIAZEM HCL ER COATED BEADS 240 MG PO CP24: 240.0000 mg | ORAL_CAPSULE | Freq: Every day | ORAL | Status: DC

## 2013-05-29 MED ORDER — ZOLPIDEM TARTRATE 10 MG PO TABS
10.0000 mg | ORAL_TABLET | Freq: Every evening | ORAL | Status: DC | PRN
Start: 2013-05-29 — End: 2013-05-29

## 2013-05-29 NOTE — Progress Notes (Signed)
Patient ID: Seth Newton, male   DOB: 16-Aug-1965, 48 y.o.   MRN: [id number]      SUBJECTIVE:The patient is a 47 year old male who is here for follow-up on cardiovascular testing. He was hospitalized for atrial fibrillation with a rapid ventricular response and acute systolic heart failure. He also has a history of alcoholism which is deemed to be the etiology of his cardiomyopathy (vs possibly tachycardia-mediated). He also has hypertension. His CHADS-Vasc score is 2 (heart failure, HTN) but due to his ongoing alcohol use and history of medication noncompliance, anticoagulants were deferred at discharge. He also has a h/o dietary non-compliance (known to consume lots of high-sodium foods).  Prior to hospitalization at [facility] for the aforementioned conditions, he was hospitalized at [facility] for alcoholic pancreatitis.  He has a pattern of pain medication (narcotics) seeking behavior.  He drinks approximately 2 beers 3 days out of the week. He occasionally experiences chest pain either during rest or with exertion and this lasts seconds. He occasionally has dizziness and lightheadedness shortly after standing up and has been instructed by his home health nurse to do so slowly. He denies syncope. He does feel short of breath intermittently with exertion. He denies leg swelling. 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 He has been referred to a pain clinic for chronic opioid management by his PCP.  His Holter monitor showed atrial fibrillation with an average heart rate of 80 beats per minute. His Lexiscan Cardiolite showed normal myocardial perfusion with no evidence of ischemia or scar. A repeat echocardiogram on 05/17/2013 showed mild left ventricular enlargement, mildly reduced left ventricle systolic function, EF 45-50%, with diffuse hypokinesis. It was mentioned that the patient was in atrial fibrillation with a rapid ventricular response  during the exam. He also had moderate biatrial enlargement and mild dilatation of the aortic root.  He was apparently recently hospitalized at [facility] for atrial fibrillation with a rapid ventricular response, dyspnea, and an elevated d-dimer. I thoroughly reviewed all relevant hospitalization records. A CT angiogram showed no evidence of pulmonary embolism but did show some mild interstitial edema. There was noted to be ectasia of the ascending thoracic aorta without aneurysm. The aortic root was 3.8 cm in the ascending thoracic aero 3.9 cm. He had been admitted with nausea, vomiting and diarrhea for 2 days. His potassium was 2.4 on admission. He also had a fever and was noted to be hyponatremic. His magnesium was also noted to be low at 1.2. His TSH was 0.33 with a free T4 of 0.85. Platelets were normal at 186 and hemoglobin was normal at 13.5. White cell count was normal at 9.4.   He was discharged on aspirin 81 mg, Cardizem 180 mg, Lasix 40 mg daily, Xarelto 20 mg, and metoprolol 75 mg daily.   He said he hasn't slept in 7 days. He has felt this way prior to his hospitalization at [facility]. He does not think it is due to the medications. About one week ago he felt lightheaded and dizzy while urinating and banged his head on the sheet rock and put a hole in the wall. He has also been having episodes of confusion as per his mother and apparently Dr. Doyne Keel, one of the hospitalists at [facility], recommended he might consider seeing a neurologist.  He does feels like his heart races with exertion.       Allergies  Allergen Reactions  . Gabapentin Other (  See Comments)    Unknown   . Penicillins Rash    Current Outpatient Prescriptions  Medication Sig Dispense Refill  . aspirin EC 81 MG tablet Take 81 mg by mouth daily.      Marland Kitchen. diltiazem (CARDIZEM CD) 180 MG 24 hr capsule Take 1 capsule (180 mg total) by mouth daily.  30 capsule  6  . folic acid (FOLVITE) 1 MG tablet Take 1 mg by mouth  daily.      . furosemide (LASIX) 40 MG tablet Take 1 tablet (40 mg total) by mouth daily.  30 tablet  6  . metoprolol (LOPRESSOR) 50 MG tablet Take 75 mg by mouth 2 (two) times daily.      . Multiple Vitamin (MULTIVITAMIN WITH MINERALS) TABS tablet Take 1 tablet by mouth daily.      . ondansetron (ZOFRAN) 4 MG tablet Take 1 tablet (4 mg total) by mouth every 8 (eight) hours as needed for nausea or vomiting.  20 tablet  0  . pantoprazole (PROTONIX) 40 MG tablet Take 40 mg by mouth daily.      . potassium chloride SA (K-DUR,KLOR-CON) 20 MEQ tablet Take 1 tablet (20 mEq total) by mouth daily.  30 tablet  6  . Rivaroxaban (XARELTO) 20 MG TABS tablet Take 1 tablet (20 mg total) by mouth daily with supper.  30 tablet  6  . thiamine 100 MG tablet Take 100 mg by mouth daily.      Marland Kitchen. docusate sodium (COLACE) 100 MG capsule Take 100 mg by mouth daily.      . Oxycodone HCl 10 MG TABS       . oxymetazoline (AFRIN) 0.05 % nasal spray Place 2 sprays into the nose 2 (two) times daily as needed for congestion.       No current facility-administered medications for this visit.    Past Medical History  Diagnosis Date  . Seizures   . Chronic back pain   . Chronic neck pain   . Essential hypertension, benign   . A-fib   . Hepatitis C   . Chronic systolic congestive heart failure 03/12/2013  . Atrial fibrillation 03/12/2013  . Pancreatitis   . Alcohol abuse     Past Surgical History  Procedure Laterality Date  . Back surgery      neck  . Abdominal surgery      for gastric ulcers?    History   Social History  . Marital Status: Divorced    Spouse Name: N/A    Number of Children: N/A  . Years of Education: N/A   Occupational History  . Not on file.   Social History Main Topics  . Smoking status: Never Smoker   . Smokeless tobacco: Never Used  . Alcohol Use: No  . Drug Use: No  . Sexual Activity: Not on file   Other Topics Concern  . Not on file   Social History Narrative  . No  narrative on file     Filed Vitals:   05/29/13 1145  Height: 6' (1.829 m)  Weight: 193 lb (87.544 kg)    BP 105/75 Pulse 82     PHYSICAL EXAM General: NAD  Neck: No JVD, no thyromegaly or thyroid nodule.  Lungs: Clear to auscultation bilaterally with normal respiratory effort.  CV: Nondisplaced PMI. Heart irregular, rate-controlled, normal S1/S2, no murmur. No peripheral edema. No carotid bruit. Normal pedal pulses.  Abdomen: Soft, nontender, no hepatosplenomegaly, no distention.  Neurologic: Alert and oriented x 3.  Psych: Normal affect.  Extremities: No clubbing or cyanosis.    ECG: reviewed and available in electronic records.      ASSESSMENT AND PLAN: 1. Persistent atrial fibrillation: HR is controlled with an average HR of 80 bpm, as per 24-hour Holter monitor. He is not in heart failure at present. No plans for cardioversion at present. Given h/o falls with increased risk for head trauma, will d/c Xarelto. I will increase his long-acting diltiazem 240 mg daily for more optimal heart rate control. Will need to monitor his blood pressure with this increased dose. Currently on metoprolol 75 mg daily. 2. Chronic systolic heart failure/cardiomyopathy: nuclear stress testing revealed normal myocardial perfusion, thus effectively ruling out ischemia as a potential etiology. Continue metoprolol and Lasix. Given his episodic lightheadedness and dizziness (orthostasis), will hold off on addition of ACEI or spironolactone at present. Current EF 45-50%, increased from 30%. Had been 60-65% in 08/2012. Currently euvolemic. Will check BMET and magnesium as he was both hypokalemic and hypomagnesemic in the hospital. 3. HTN: controlled on current therapy.  4. Chronic leg pain: he will have to f/u with pain clinic, as I made it clear that I will not be prescribing chronic opioids but will strictly be managing his cardiovascular issues.  5. Confusion: I will make a referral to a neurologist.  Ideally, if this provider specializes in sleep medicine as well, this may be helpful for the patient as the patient has a significant sleeping disorder. 6. Sleep disturbance:  I will provide a 14-day supply of Ambien 5 mg qhs prn with 1 refill only.  Time spent: 40 minutes.  Dispo: f/u 2 months.    Prentice Docker, M.D., F.A.C.C.

## 2013-05-29 NOTE — Patient Instructions (Addendum)
   Stop Xarelto  Increase Diltiazem CD to 240mg  daily - new sent to pharm  Ambien 5mg  at bedtime as needed for sleep - future refills from primary MD or neurology  Referral to Neurologist   Labs for BMET, Magnesium  Office will contact with results via phone or letter.   Follow up in  2 months

## 2013-05-31 ENCOUNTER — Other Ambulatory Visit: Payer: Self-pay | Admitting: *Deleted

## 2013-05-31 DIAGNOSIS — G479 Sleep disorder, unspecified: Secondary | ICD-10-CM

## 2013-05-31 DIAGNOSIS — F05 Delirium due to known physiological condition: Secondary | ICD-10-CM

## 2013-06-19 ENCOUNTER — Ambulatory Visit: Payer: Medicare Other | Admitting: Neurology

## 2013-07-10 ENCOUNTER — Ambulatory Visit: Payer: Medicare Other | Admitting: Neurology

## 2013-07-18 ENCOUNTER — Ambulatory Visit: Payer: Self-pay | Admitting: Neurology

## 2013-07-24 ENCOUNTER — Ambulatory Visit: Payer: Self-pay | Admitting: Neurology

## 2013-07-31 ENCOUNTER — Other Ambulatory Visit: Payer: Self-pay | Admitting: *Deleted

## 2013-07-31 ENCOUNTER — Other Ambulatory Visit: Payer: Self-pay | Admitting: Cardiovascular Disease

## 2013-07-31 MED ORDER — METOPROLOL TARTRATE 50 MG PO TABS: 75.0000 mg | ORAL_TABLET | Freq: Two times a day (BID) | ORAL | Status: DC

## 2013-08-04 ENCOUNTER — Encounter: Payer: Self-pay | Admitting: *Deleted

## 2013-08-04 ENCOUNTER — Encounter: Payer: Self-pay | Admitting: Cardiovascular Disease

## 2013-08-04 ENCOUNTER — Other Ambulatory Visit: Payer: Self-pay | Admitting: *Deleted

## 2013-08-04 ENCOUNTER — Ambulatory Visit (INDEPENDENT_AMBULATORY_CARE_PROVIDER_SITE_OTHER): Payer: Medicare Other | Admitting: Cardiovascular Disease

## 2013-08-04 VITALS — BP 106/74 | HR 56 | Ht 72.0 in | Wt 201.0 lb

## 2013-08-04 DIAGNOSIS — M2550 Pain in unspecified joint: Secondary | ICD-10-CM | POA: Diagnosis not present

## 2013-08-04 DIAGNOSIS — I4891 Unspecified atrial fibrillation: Secondary | ICD-10-CM

## 2013-08-04 DIAGNOSIS — I5022 Chronic systolic (congestive) heart failure: Secondary | ICD-10-CM

## 2013-08-04 DIAGNOSIS — G479 Sleep disorder, unspecified: Secondary | ICD-10-CM

## 2013-08-04 DIAGNOSIS — I429 Cardiomyopathy, unspecified: Secondary | ICD-10-CM

## 2013-08-04 DIAGNOSIS — I10 Essential (primary) hypertension: Secondary | ICD-10-CM

## 2013-08-04 DIAGNOSIS — I428 Other cardiomyopathies: Secondary | ICD-10-CM

## 2013-08-04 DIAGNOSIS — Z79899 Other long term (current) drug therapy: Secondary | ICD-10-CM

## 2013-08-04 NOTE — Patient Instructions (Signed)
   Referral to pain clinic  Establish with primary MD - list provided Continue all current medications. Your physician wants you to follow up in: 6 months.  You will receive a reminder letter in the mail one-two months in advance.  If you don't receive a letter, please call our office to schedule the follow up appointment

## 2013-08-04 NOTE — Progress Notes (Signed)
Patient ID: Seth Newton, male   DOB: 1965/10/15, 48 y.o.   MRN: [id number]      SUBJECTIVE: The patient is here to followup for atrial fibrillation and cardiomyopathy. Previous nuclear stress testing did not demonstrate any evidence of ischemia. He has not had any syncopal episodes since his last visit. He remains compliant with medications. He denies chest pain. He very seldom has dizziness and palpitations. He does say he does not sleep well at nights and I previously referred him to a neurologist, who he is scheduled to see this upcoming Monday. He continues to complain about generalized body pains and requests pain medications. He has a history of narcotic seeking behavior. He said that he no longer has a primary care physician.    Allergies  Allergen Reactions  . Gabapentin Other (See Comments)    Unknown   . Penicillins Rash    Current Outpatient Prescriptions  Medication Sig Dispense Refill  . aspirin EC 81 MG tablet Take 81 mg by mouth daily.      Marland Kitchen. diltiazem (CARDIZEM CD) 240 MG 24 hr capsule Take 1 capsule (240 mg total) by mouth daily.  30 capsule  6  . docusate sodium (COLACE) 100 MG capsule Take 100 mg by mouth daily.      . folic acid (FOLVITE) 1 MG tablet Take 1 mg by mouth daily.      . furosemide (LASIX) 40 MG tablet Take 1 tablet (40 mg total) by mouth daily.  30 tablet  6  . metoprolol succinate (TOPROL-XL) 100 MG 24 hr tablet Take 1.5 tablets by mouth daily.      . Multiple Vitamin (MULTIVITAMIN WITH MINERALS) TABS tablet Take 1 tablet by mouth daily.      . ondansetron (ZOFRAN) 4 MG tablet Take 1 tablet (4 mg total) by mouth every 8 (eight) hours as needed for nausea or vomiting.  20 tablet  0  . oxymetazoline (AFRIN) 0.05 % nasal spray Place 2 sprays into the nose 2 (two) times daily as needed for congestion.      . pantoprazole (PROTONIX) 40 MG tablet Take 40 mg by mouth daily.      . potassium chloride SA (K-DUR,KLOR-CON) 20 MEQ tablet Take 1 tablet (20 mEq  total) by mouth daily.  30 tablet  6  . thiamine 100 MG tablet Take 100 mg by mouth daily.      . Oxycodone HCl 10 MG TABS Take 10 mg by mouth every 6 (six) hours as needed.       . zolpidem (AMBIEN) 5 MG tablet Take 1 tablet (5 mg total) by mouth at bedtime as needed for sleep.  14 tablet  1   No current facility-administered medications for this visit.    Past Medical History  Diagnosis Date  . Seizures   . Chronic back pain   . Chronic neck pain   . Essential hypertension, benign   . A-fib   . Hepatitis C   . Chronic systolic congestive heart failure 03/12/2013  . Atrial fibrillation 03/12/2013  . Pancreatitis   . Alcohol abuse     Past Surgical History  Procedure Laterality Date  . Back surgery      neck  . Abdominal surgery      for gastric ulcers?    History   Social History  . Marital Status: Divorced    Spouse Name: N/A    Number of Children: N/A  . Years of Education: N/A   Occupational History  .  Not on file.   Social History Main Topics  . Smoking status: Never Smoker   . Smokeless tobacco: Never Used  . Alcohol Use: No  . Drug Use: No  . Sexual Activity: Not on file   Other Topics Concern  . Not on file   Social History Narrative  . No narrative on file     Filed Vitals:   08/04/13 1125  BP: 106/74  Pulse: 56  Height: 6' (1.829 m)  Weight: 201 lb (91.173 kg)    PHYSICAL EXAM General: NAD  Neck: No JVD, no thyromegaly or thyroid nodule.  Lungs: Clear to auscultation bilaterally with normal respiratory effort.  CV: Nondisplaced PMI. Heart irregular, rate-controlled, normal S1/S2, no murmur. No peripheral edema. No carotid bruit. Normal pedal pulses.  Abdomen: Soft, nontender, no hepatosplenomegaly, no distention.  Neurologic: Alert and oriented x 3.  Psych: Normal affect.  Extremities: No clubbing or cyanosis.    ECG: reviewed and available in electronic records.      ASSESSMENT AND PLAN: 1. Persistent atrial fibrillation: HR  is controlled and pt is relatively asymptomatic. He is not in heart failure at present. No plans for cardioversion at present. Given prior h/o falls with increased risk for head trauma, h/o noncompliance, and h/o alcoholism, will defer anticoagulation at present. I will continue long-acting diltiazem 240 mg daily and metoprolol succinate, which appears to have been increased by his PCP to 150 mg daily.  2. Chronic systolic heart failure/cardiomyopathy: Nuclear stress testing revealed normal myocardial perfusion, thus effectively ruling out ischemia as a potential etiology. Continue metoprolol and Lasix. Given his h/o episodic lightheadedness and dizziness (orthostasis) along with relative hypotension, will hold off on addition of ACEI or spironolactone at present. EF 45-50%, increased from 30%. Had been 60-65% in 08/2012. Currently euvolemic. He did not have BMET and magnesium checked as previously ordered. 3. HTN: Controlled on current therapy.  4. Chronic leg and diffuse body pain: He will have to f/u with pain clinic, as I made it clear at several visits that I will not be prescribing chronic opioids but will strictly be managing his cardiovascular issues.  5. Confusion: I previously made a referral to a neurologist. Ideally, if this provider specializes in sleep medicine as well, this may be helpful for the patient as the patient has a significant sleeping disorder.  6. Sleep disturbance: Scheduled to see neurology.   Dispo: f/u 6 months.    Prentice Docker, M.D., F.A.C.C.

## 2013-08-07 ENCOUNTER — Encounter: Payer: Self-pay | Admitting: Neurology

## 2013-08-07 ENCOUNTER — Ambulatory Visit (INDEPENDENT_AMBULATORY_CARE_PROVIDER_SITE_OTHER): Payer: Medicare Other | Admitting: Neurology

## 2013-08-07 VITALS — BP 111/67 | HR 51 | Ht 72.0 in | Wt 194.0 lb

## 2013-08-07 DIAGNOSIS — R413 Other amnesia: Secondary | ICD-10-CM

## 2013-08-07 NOTE — Patient Instructions (Addendum)
We will do a brain MRI.  We will do an EEG (brain wave test). Please try to find a primary care doctor: [facility] on [address].

## 2013-08-07 NOTE — Progress Notes (Signed)
Subjective:    Patient ID: Seth Newton is a 48 y.o. male.  HPI    Huston Foley, MD, PhD [facility] [address], [address] [address] [place], [place] [address]  Dear Dr. Purvis Sheffield,   I saw [name], upon your kind request in my neurologic clinic today for initial consultation of his mental status changes. The patient is accompanied by his mother today. As you know, Mr. Siemen is a 48 year old right-handed gentleman with an underlying complex medical history of A. fib (status post recent hospitalization at [facility] for A. fib with RVR, acute systolic heart failure and elevated d-dimer), cardiomyopathy (deemed secondary to alcoholism versus tachycardia mediated), alcohol abuse (2-3 beers per day and stopped 2-3 months ago), history of medication noncompliance as well as dietary noncompliance and history of alcoholic pancreatitis for which he was hospitalized at [facility] (4 or 5 months ago), hepatitis C, hypertension, chronic neck and back pain for which he is on narcotic pain medicine (ran out, and is soon to be followed by pain management), and insomnia, who has had intermittent episodes of confusion and memory loss x 2 years, per mother. He had an episode of lightheadedness and dizziness while urinating last month and bumped his head into the drywall which resulted in a hole in the wall. He was told he had a seizure. There is no family history of epilepsy. He has a history of recurrent syncopal spells. There is no history of staring spells.  He has not had a MRI brain. His MGM had AD. He had a CTH without contrast on 09/07/12, which was negative.  He has issues with insomnia and has been on Ambien as needed. He endorses anxiety.  He has had issues with short term memory with forgetfulness and misplacing things, and asking the same questions over and over again. He currently does not have a PCP. He currently does not drive.   His Past Medical History  Is Significant For: Past Medical History  Diagnosis Date  . Seizures   . Chronic back pain   . Chronic neck pain   . Essential hypertension, benign   . A-fib   . Hepatitis C   . Chronic systolic congestive heart failure 03/12/2013  . Atrial fibrillation 03/12/2013  . Pancreatitis   . Alcohol abuse     His Past Surgical History Is Significant For: Past Surgical History  Procedure Laterality Date  . Back surgery      neck  . Abdominal surgery      for gastric ulcers?    His Family History Is Significant For: Family History  Problem Relation Age of Onset  . Hypertension Father   . Cancer Father     His Social History Is Significant For: History   Social History  . Marital Status: Divorced    Spouse Name: N/A    Number of Children: 2  . Years of Education: 12th   Occupational History  . n/A    Social History Main Topics  . Smoking status: Never Smoker   . Smokeless tobacco: Never Used  . Alcohol Use: No  . Drug Use: No  . Sexual Activity: Yes   Other Topics Concern  . None   Social History Narrative   Patient lives at home with his mother he is right handed.   Patient drinks caffinated drinks on OCC.    His Allergies Are:  Allergies  Allergen Reactions  . Gabapentin Other (See Comments)  Unknown   . Penicillins Rash  :   His Current Medications Are:  Outpatient Encounter Prescriptions as of 08/07/2013  Medication Sig  . aspirin EC 81 MG tablet Take 81 mg by mouth daily.  Marland Kitchen. diltiazem (CARDIZEM CD) 240 MG 24 hr capsule Take 1 capsule (240 mg total) by mouth daily.  Marland Kitchen. docusate sodium (COLACE) 100 MG capsule Take 100 mg by mouth daily.  . folic acid (FOLVITE) 1 MG tablet Take 1 mg by mouth daily.  . furosemide (LASIX) 40 MG tablet Take 1 tablet (40 mg total) by mouth daily.  . metoprolol succinate (TOPROL-XL) 100 MG 24 hr tablet Take 1.5 tablets by mouth daily.  . Multiple Vitamin (MULTIVITAMIN WITH MINERALS) TABS tablet Take 1 tablet by mouth  daily.  . ondansetron (ZOFRAN) 4 MG tablet Take 1 tablet (4 mg total) by mouth every 8 (eight) hours as needed for nausea or vomiting.  . Oxycodone HCl 10 MG TABS Take 10 mg by mouth every 6 (six) hours as needed.   Marland Kitchen. oxymetazoline (AFRIN) 0.05 % nasal spray Place 2 sprays into the nose 2 (two) times daily as needed for congestion.  . pantoprazole (PROTONIX) 40 MG tablet Take 40 mg by mouth daily.  . potassium chloride SA (K-DUR,KLOR-CON) 20 MEQ tablet Take 1 tablet (20 mEq total) by mouth daily.  Marland Kitchen. thiamine 100 MG tablet Take 100 mg by mouth daily.  Marland Kitchen. zolpidem (AMBIEN) 5 MG tablet Take 1 tablet (5 mg total) by mouth at bedtime as needed for sleep.   Review of Systems:  Out of a complete 14 point review of systems, all are reviewed and negative with the exception of these symptoms as listed below:   Review of Systems  Constitutional: Positive for fever and chills.  Eyes: Positive for visual disturbance.  Respiratory: Positive for shortness of breath.   Cardiovascular: Positive for chest pain, palpitations and leg swelling.  Endocrine: Positive for heat intolerance.       Increased thirst, Flushing  Musculoskeletal: Positive for joint swelling.       Joint pain aching muscles  Neurological: Positive for seizures.       Passing out,Memory loss  Psychiatric/Behavioral: Positive for confusion, sleep disturbance and agitation.        Depression,Anxiety,Not Enough sleep,Racing thoughts    Objective:  Neurologic Exam  Physical Exam Physical Examination:   Filed Vitals:   08/07/13 1311  BP: 111/67  Pulse: 51    General Examination: The patient is a very pleasant 48 y.o. male in no acute distress. He appears well-developed and well-nourished and adequately groomed.   HEENT: Normocephalic, atraumatic, pupils are equal, round and reactive to light and accommodation. Funduscopic exam is normal with sharp disc margins noted. Extraocular tracking is good without limitation to gaze  excursion or nystagmus noted. Normal smooth pursuit is noted. Hearing is grossly intact. Tympanic membranes are clear bilaterally. Face is symmetric with normal facial animation and normal facial sensation. Speech is clear with no dysarthria noted. There is no hypophonia. There is no lip, neck/head, jaw or voice tremor. Neck exam shows decrease in range of motion in his neck. There are no carotid bruits on auscultation. Oropharynx exam reveals: moderate mouth dryness, adequate dental hygiene and mild airway crowding, due to narrow airway. Mallampati is class II. Tongue protrudes centrally and palate elevates symmetrically.    Chest: Clear to auscultation without wheezing, rhonchi or crackles noted.  Heart: S1+S2+0, regular and normal without murmurs, rubs or gallops noted.   Abdomen:  Soft, non-tender and non-distended with normal bowel sounds appreciated on auscultation.  Extremities: There is no pitting edema in the distal lower extremities bilaterally. Pedal pulses are intact.  Skin: Warm and dry without trophic changes noted. There are no varicose veins.  Musculoskeletal: exam reveals no obvious joint deformities, tenderness or joint swelling or erythema.   Neurologically:  Mental status: The patient is awake, alert and oriented in all 4 spheres. His immediate and remote memory, attention, language skills and fund of knowledge are mildly impaired. There is no evidence of aphasia, agnosia, apraxia or anomia. Speech is clear with normal prosody and enunciation. Thought process is linear. Mood is constricted and affect is blunted.  His MMSE is 25/30, clock drawing is 4 out of 4 and animal fluency test is 9 per minute. Cranial nerves II - XII are as described above under HEENT exam. In addition: shoulder shrug is normal with equal shoulder height noted. Motor exam: Normal bulk, strength and tone is noted. There is no drift, tremor or rebound. Romberg is negative. Reflexes are 2+ throughout.  Babinski: Toes are flexor bilaterally. Fine motor skills and coordination: intact with normal finger taps, normal Boggess movements, normal rapid alternating patting, normal foot taps and normal foot agility.  Cerebellar testing: No dysmetria or intention tremor on finger to nose testing. Heel to shin is unremarkable bilaterally. There is no truncal or gait ataxia.  Sensory exam: intact to light touch, pinprick, vibration, temperature sense and proprioception in the upper and lower extremities.  Gait, station and balance: He stands easily. No veering to one side is noted. No leaning to one side is noted. Posture is age-appropriate and stance is narrow based. Gait shows normal stride length and normal pace. No problems turning are noted. He turns en bloc. Tandem walk is unremarkable. Intact toe and heel stance is noted.               Assessment and Plan:   In summary, MACON ALEJANDRO is a very pleasant 48 y.o.-year old male with an underlying complex medical history of A. fib (status post recent hospitalization at [facility] for A. fib with RVR, acute systolic heart failure and elevated d-dimer), cardiomyopathy (deemed secondary to alcoholism versus tachycardia mediated), alcohol abuse (2-3 beers per day and stopped 2-3 months ago), history of medication noncompliance as well as dietary noncompliance and history of alcoholic pancreatitis for which he was hospitalized at [facility] (4 or 5 months ago), hepatitis C, hypertension, chronic neck and back pain for which he is on narcotic pain medicine (ran out, and is soon to be followed by pain management), and insomnia, who presents with a two-year history of memory loss. His MMSE is in keeping with mild memory loss. I would like to proceed with further testing the form of brain MRI without contrast, EEG, and formal memory testing in the form of neurocognitive evaluation with a neuropsychologist. I will see him back after these tests are completed. His  history is not compelling for seizures but he was told apparently at [facility] that he had seizures or a seizure. I have asked him to request those records from the hospital. He is currently not driving and his discharge from resuming driving at this time until we have more records available. His primary care physician has left. He is encouraged to find a new primary care physician. I answered all their questions today and the patient and his mother were in agreement with the above outlined plan. Thank you  very much for allowing me to participate in the care of this nice patient. If I can be of any further assistance to you please do not hesitate to call me at [phone].  Sincerely,   Star Age, MD, PhD

## 2013-08-22 ENCOUNTER — Other Ambulatory Visit: Payer: Self-pay | Admitting: Neurology

## 2013-08-22 DIAGNOSIS — Z139 Encounter for screening, unspecified: Secondary | ICD-10-CM

## 2013-08-23 ENCOUNTER — Other Ambulatory Visit: Payer: Medicare Other

## 2013-09-04 ENCOUNTER — Ambulatory Visit
Admission: RE | Admit: 2013-09-04 | Discharge: 2013-09-04 | Disposition: A | Discharge status: Home / Self Care | Payer: Medicare Other | Source: Ambulatory Visit | Attending: Neurology | Admitting: Neurology

## 2013-09-04 ENCOUNTER — Ambulatory Visit (INDEPENDENT_AMBULATORY_CARE_PROVIDER_SITE_OTHER): Payer: Medicare Other | Admitting: Radiology

## 2013-09-04 DIAGNOSIS — R413 Other amnesia: Secondary | ICD-10-CM

## 2013-09-04 DIAGNOSIS — Z135 Encounter for screening for eye and ear disorders: Secondary | ICD-10-CM | POA: Diagnosis not present

## 2013-09-04 DIAGNOSIS — Z139 Encounter for screening, unspecified: Secondary | ICD-10-CM

## 2013-09-04 NOTE — Progress Notes (Signed)
Quick Note:  Please call and advise the patient that the EEG or brain wave test we performed was reported as normal in the awake and drowsy states. We checked for abnormal electrical discharges in the brain waves and the report suggested normal findings. No further action is required on this test at this time. Please remind patient to keep any upcoming appointments or tests and to call us with any interim questions, concerns, problems or updates. Thanks,  Rishita Petron, MD, PhD    ______ 

## 2013-09-04 NOTE — Procedures (Signed)
    History:  Seth Newton is a 48 year old gentleman with a history of atrial fibrillation, hepatitis C, and a history of alcohol abuse. He is being evaluated for episodes of confusion and memory loss that have been occurring for 2 years.  This is a routine EEG. No skull defects are noted. Medications include aspirin, diltiazem, Colace, folic acid, Lasix, metoprolol, multivitamins, Zofran, oxycodone, Afrin, protonix, potassium, thiamine, and Ambien.   EEG classification: Normal awake and drowsy  Description of the recording: The background rhythms of this recording consists of a fairly well modulated medium amplitude alpha rhythm of 9 Hz that is reactive to eye opening and closure. As the record progresses, the patient appears to remain in the waking state throughout the recording. Photic stimulation was performed, resulting in a bilateral and symmetric photic driving response. Hyperventilation was also performed, resulting in a minimal buildup of the background rhythm activities without significant slowing seen. Toward the end of the recording, the patient enters the drowsy state with slight symmetric slowing seen. The patient never enters stage II sleep. At no time during the recording does there appear to be evidence of spike or spike wave discharges or evidence of focal slowing. EKG monitor shows an irregular heart rhythm with a heart rate of 60.  Impression: This is a normal EEG recording in the waking and drowsy state. No evidence of ictal or interictal discharges are seen.

## 2013-09-05 NOTE — Progress Notes (Signed)
Quick Note:  Shared normal EEG results with patient per Dr Teofilo Pod findings, he verbalized understanding ______

## 2013-09-13 ENCOUNTER — Telehealth: Payer: Self-pay | Admitting: Neurology

## 2013-09-13 DIAGNOSIS — R413 Other amnesia: Secondary | ICD-10-CM

## 2013-09-13 DIAGNOSIS — B1920 Unspecified viral hepatitis C without hepatic coma: Secondary | ICD-10-CM

## 2013-09-13 DIAGNOSIS — R55 Syncope and collapse: Secondary | ICD-10-CM

## 2013-09-13 DIAGNOSIS — F1010 Alcohol abuse, uncomplicated: Secondary | ICD-10-CM

## 2013-09-13 DIAGNOSIS — R569 Unspecified convulsions: Secondary | ICD-10-CM

## 2013-09-13 DIAGNOSIS — R930 Abnormal findings on diagnostic imaging of skull and head, not elsewhere classified: Secondary | ICD-10-CM

## 2013-09-13 NOTE — Telephone Encounter (Signed)
Please call patient and advised him that there were some abnormalities seen on his brain MRI that need further clarification. In order to better understand the changes I would like for him to come back for a repeat MRI brain with contrast administration. Nothing acute was seen. Has he had any other injury to his head in the distant past? He did report bumping his head and while changes to the brain can occur after head injury, I would recommend another look at the brain scan with contrast. Order placed. Pls notify pt and advise him there there is no immediate cause for concern, I just need more information on the scan. Huston Foley, MD, PhD [facility])

## 2013-09-13 NOTE — Progress Notes (Signed)
Quick Note:  See phone note from today, which I accidentally closed. Pls call pt.  Huston Foley, MD, PhD [facility]  ______

## 2013-09-14 ENCOUNTER — Telehealth: Payer: Self-pay | Admitting: *Deleted
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
## 2013-09-15 ENCOUNTER — Telehealth: Payer: Self-pay | Admitting: Neurology

## 2013-09-15 ENCOUNTER — Encounter: Payer: Self-pay | Admitting: *Deleted

## 2013-09-15 NOTE — Telephone Encounter (Signed)
Called the patient twice today and he could not be reached. Spoke with  Dr Leonides Cave and he said that he has tried to contact patient 4 times and could only reach patient once, offered to schedule appt but patient said that he would call back but has not and also called [facility] and they will contact patient to schedule appt for MRI.

## 2013-09-15 NOTE — Progress Notes (Signed)
Quick Note:  Called patient with results, verbalized understanding ______

## 2013-09-15 NOTE — Telephone Encounter (Signed)
Thanks

## 2013-09-15 NOTE — Telephone Encounter (Signed)
Called [facility](Dr Zelson) and he was unable to reach patient after several attempts,message was sent to triage and from Sandy's telephone note (09/14/13)  has sent a letter by mail(no response yet). Called patient and could not reach , lt VM message to call back

## 2013-09-15 NOTE — Telephone Encounter (Signed)
Please clarify the patient has been scheduled for his MRI brain with contrast and with Dr. Leonides Cave, thx

## 2013-09-15 NOTE — Telephone Encounter (Signed)
Please contact pt again and advise him to schedule his appt with Dr. Leonides Cave and please check with Bradly Chris regarding MRI brain

## 2013-09-15 NOTE — Telephone Encounter (Signed)
I mailed letter re: appt for neuropsych eval.

## 2013-09-27 NOTE — Telephone Encounter (Signed)
Please see office visit notes pertaining to this medication.

## 2013-10-02 ENCOUNTER — Inpatient Hospital Stay: Admission: RE | Admit: 2013-10-02 | Payer: Medicare Other | Source: Ambulatory Visit

## 2013-12-05 ENCOUNTER — Telehealth: Payer: Self-pay | Admitting: Cardiovascular Disease

## 2013-12-05 NOTE — Telephone Encounter (Signed)
Seth Newton states that [facility] told him that his metoprolol succinate (TOPROL-XL) 100 MG 24  Was denied. Patient is out of his medication

## 2013-12-05 NOTE — Telephone Encounter (Signed)
Left message to return call 

## 2013-12-11 NOTE — Telephone Encounter (Addendum)
No return call to present date.   TC to [facility] - Patient last picked up Metoprolol 50mg  - 1 1/2 BID  Per Dr. Purvis Sheffield last OV note - I will continue long-acting diltiazem 240 mg daily and metoprolol succinate, which appears to have been increased by his PCP to 150 mg daily.   Our office did not receive or deny any request.

## 2013-12-20 ENCOUNTER — Other Ambulatory Visit: Payer: Self-pay | Admitting: Cardiovascular Disease

## 2014-01-02 ENCOUNTER — Ambulatory Visit: Payer: Medicare Other | Admitting: Neurology

## 2014-01-18 ENCOUNTER — Other Ambulatory Visit: Payer: Self-pay | Admitting: Cardiovascular Disease

## 2014-01-24 ENCOUNTER — Other Ambulatory Visit: Payer: Self-pay | Admitting: *Deleted

## 2014-01-24 MED ORDER — METOPROLOL SUCCINATE ER 100 MG PO TB24: ORAL_TABLET | ORAL | Status: DC

## 2014-02-06 ENCOUNTER — Ambulatory Visit (INDEPENDENT_AMBULATORY_CARE_PROVIDER_SITE_OTHER): Payer: Medicare Other | Admitting: Cardiovascular Disease

## 2014-02-06 ENCOUNTER — Encounter: Payer: Self-pay | Admitting: Cardiovascular Disease

## 2014-02-06 VITALS — BP 103/69 | HR 79 | Ht 72.0 in | Wt 195.0 lb

## 2014-02-06 DIAGNOSIS — G894 Chronic pain syndrome: Secondary | ICD-10-CM

## 2014-02-06 DIAGNOSIS — I429 Cardiomyopathy, unspecified: Secondary | ICD-10-CM | POA: Diagnosis not present

## 2014-02-06 DIAGNOSIS — I5022 Chronic systolic (congestive) heart failure: Secondary | ICD-10-CM | POA: Diagnosis not present

## 2014-02-06 DIAGNOSIS — I10 Essential (primary) hypertension: Secondary | ICD-10-CM

## 2014-02-06 DIAGNOSIS — I482 Chronic atrial fibrillation, unspecified: Secondary | ICD-10-CM

## 2014-02-06 DIAGNOSIS — M2550 Pain in unspecified joint: Secondary | ICD-10-CM

## 2014-02-06 MED ORDER — OXYCODONE HCL 10 MG PO TABS: 10.0000 mg | ORAL_TABLET | Freq: Four times a day (QID) | ORAL | Status: AC | PRN

## 2014-02-06 NOTE — Patient Instructions (Signed)
Continue all current medications. A one time printed script given for Oxycodone 10mg  every 6 hours as needed.  Future refills from primary physician or pain management. Referral to [facility] [place], [place] Your physician wants you to follow up in:  1 year.  You will receive a reminder letter in the mail one-two months in advance.  If you don't receive a letter, please call our office to schedule the follow up appointment

## 2014-02-06 NOTE — Progress Notes (Signed)
Patient ID: Seth Newton, male   DOB: [birth year], 48 y.o.   MRN: [id number]      SUBJECTIVE: The patient is here to followup for atrial fibrillation and cardiomyopathy. Previous nuclear stress testing did not demonstrate any evidence of ischemia.   He has not had any syncopal episodes since his last visit. He remains compliant with medications.  His primary problem relates to chronic pain syndrome. He reportedly tried to f/u on the previous referral I made to the pain clinic in [place], but they reportedly would not see him. He said his primary care physician is out on maternity leave. He continues to complain about generalized body pains and is tearful when talking about it. He denies drinking any alcohol.    Review of Systems: As per "subjective", otherwise negative.  Allergies  Allergen Reactions  . Gabapentin Other (See Comments)    Unknown   . Penicillins Rash    Current Outpatient Prescriptions  Medication Sig Dispense Refill  . aspirin EC 81 MG tablet Take 81 mg by mouth daily.    Marland Kitchen. DILTIAZEM CD 240 MG 24 hr capsule TAKE 1 CAPSULE BY MOUTH EVERY DAY 30 capsule 6  . docusate sodium (COLACE) 100 MG capsule Take 100 mg by mouth daily.    . folic acid (FOLVITE) 1 MG tablet Take 1 mg by mouth daily.    . furosemide (LASIX) 40 MG tablet TAKE 1 TABLET BY MOUTH DAILY 30 tablet 6  . metoprolol succinate (TOPROL-XL) 100 MG 24 hr tablet Take 1.5 tablets by mouth daily. 135 tablet 3  . Multiple Vitamin (MULTIVITAMIN WITH MINERALS) TABS tablet Take 1 tablet by mouth daily.    Marland Kitchen. oxymetazoline (AFRIN) 0.05 % nasal spray Place 2 sprays into the nose 2 (two) times daily as needed for congestion.    . pantoprazole (PROTONIX) 40 MG tablet Take 40 mg by mouth daily.    . potassium chloride SA (K-DUR,KLOR-CON) 20 MEQ tablet TAKE 1 TABLET BY MOUTH DAILY 30 tablet 6  . thiamine 100 MG tablet Take 100 mg by mouth daily.    . ondansetron (ZOFRAN) 4 MG tablet Take 1 tablet (4 mg total) by mouth every 8  (eight) hours as needed for nausea or vomiting. 20 tablet 0  . Oxycodone HCl 10 MG TABS Take 10 mg by mouth every 6 (six) hours as needed.     . zolpidem (AMBIEN) 5 MG tablet Take 1 tablet (5 mg total) by mouth at bedtime as needed for sleep. 14 tablet 1   No current facility-administered medications for this visit.    Past Medical History  Diagnosis Date  . Seizures   . Chronic back pain   . Chronic neck pain   . Essential hypertension, benign   . A-fib   . Hepatitis C   . Chronic systolic congestive heart failure 03/12/2013  . Atrial fibrillation 03/12/2013  . Pancreatitis   . Alcohol abuse     Past Surgical History  Procedure Laterality Date  . Back surgery      neck  . Abdominal surgery      for gastric ulcers?    History   Social History  . Marital Status: Divorced    Spouse Name: N/A    Number of Children: 2  . Years of Education: 12th   Occupational History  . n/A    Social History Main Topics  . Smoking status: Never Smoker   . Smokeless tobacco: Never Used  . Alcohol Use: No  .  Drug Use: No  . Sexual Activity: Yes   Other Topics Concern  . Not on file   Social History Narrative   Patient lives at home with his mother he is right handed.   Patient drinks caffinated drinks on OCC.     Filed Vitals:   02/06/14 1300  BP: 103/69  Pulse: 79  Height: 6' (1.829 m)  Weight: 195 lb (88.451 kg)    PHYSICAL EXAM General: NAD HEENT: Normal. Neck: No JVD, no thyromegaly. Lungs: Clear to auscultation bilaterally with normal respiratory effort. CV: Nondisplaced PMI.  Irregular rhythm, normal S1/S2, no S3, no murmur. No pretibial or periankle edema.  No carotid bruit.  Normal pedal pulses.  Abdomen: Soft, nontender, no hepatosplenomegaly, no distention.  Neurologic: Alert and oriented x 3.  Psych: Normal affect. Skin: Normal. Musculoskeletal: Normal range of motion, no gross deformities. Extremities: No clubbing or cyanosis.   ECG: Most recent ECG  reviewed.      ASSESSMENT AND PLAN: 1. Persistent atrial fibrillation: HR is controlled and pt is relatively asymptomatic. He is not in heart failure at present. No plans for cardioversion at present. Given prior h/o falls with increased risk for head trauma, h/o noncompliance, and prior h/o alcoholism, will defer anticoagulation at present. I will continue long-acting diltiazem 240 mg daily and metoprolol succinate 150 mg daily.  2. Chronic systolic heart failure/cardiomyopathy: Nuclear stress testing revealed normal myocardial perfusion, thus effectively ruling out ischemia as a potential etiology. Continue metoprolol and Lasix. Given his h/o episodic lightheadedness and dizziness (orthostasis) along with relative hypotension, will hold off on addition of ACEI or spironolactone at present. EF 45-50%, increased from 30%. Had been 60-65% in 08/2012. Currently euvolemic. 3. Essential HTN: Controlled on current therapy. No changes. 4. Chronic pain syndrome with leg and diffuse body pain: I will provide oxycodone 10 mg q 6 hrs prn for pain and provide a total of 30 tablets with no refills. I will try and make a referral to an anesthesiologist who specializes in pain management, but this should be followed by his PCP.   Dispo: f/u 1 year.   Prentice Docker, M.D., F.A.C.C.

## 2014-02-16 ENCOUNTER — Telehealth: Payer: Self-pay | Admitting: *Deleted

## 2014-02-16 NOTE — Telephone Encounter (Signed)
Mother called to inquire about pain clinic referral. Nurse informed mother that pcc was out of the office but a message would be left so that she could contact patient's mother upon arrival to office.

## 2014-02-19 NOTE — Telephone Encounter (Signed)
Spoke with Morrie Sheldon at Dr. Pernell Dupre office in [place], [place] .Marland Kitchen They are requesting records to be faxed to [phone]. They will review and advise. The office will actually notify the patient of appointment. Patient was notified of details.

## 2014-02-19 NOTE — Telephone Encounter (Signed)
Called anesthesiologist who specializes in pain management, Per Dr. Purvis Sheffield . [facility] # [phone]. Left message with Darel Hong.

## 2014-03-17 IMAGING — CT CT ABD-PELV W/ CM
2 of 4 series · 17 of 46 positions shown, 19 images · IV contrast (Omnipaque 300)
Comparison: 02/24/2013

CLINICAL DATA: Abdominal pain

EXAM:
CT ABDOMEN AND PELVIS WITH CONTRAST
TECHNIQUE: Multidetector CT imaging of the abdomen and pelvis was performed
using the standard protocol following bolus administration of
intravenous contrast.
CONTRAST:  50mL OMNIPAQUE IOHEXOL 300 MG/ML SOLN, 100mL OMNIPAQUE
IOHEXOL 300 MG/ML SOLN

[Series 2: abd_pel_with 5.0 b40f · axial · 0.81mm/px · z∈[+571,+1006]mm · 14 of 97 slices shown, 16 images]
[im 5/97  soft-tissue]
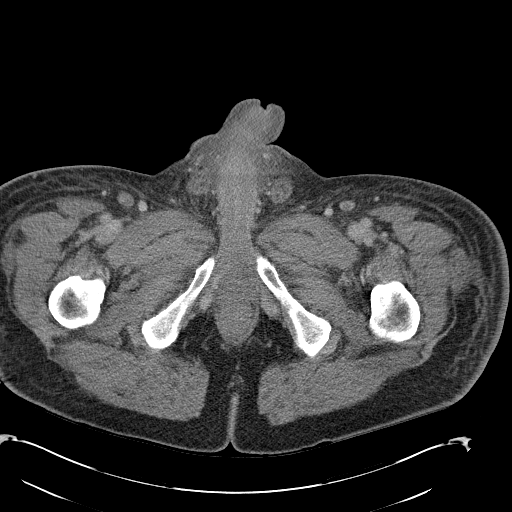
[im 5/97  bone]
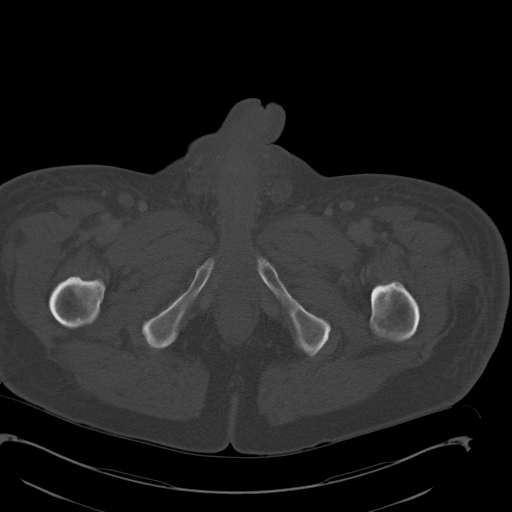
[im 14/97  soft-tissue]
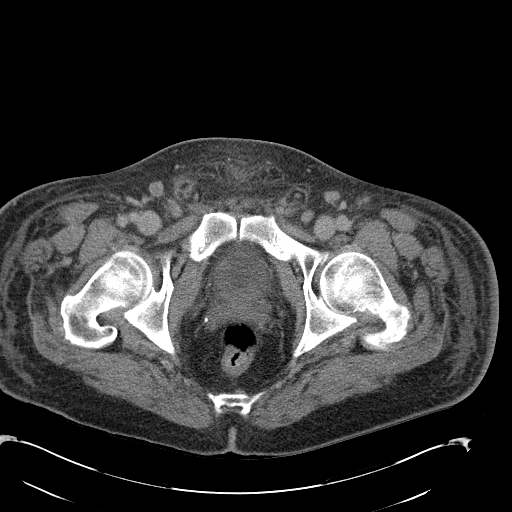
[im 19/97  soft-tissue]
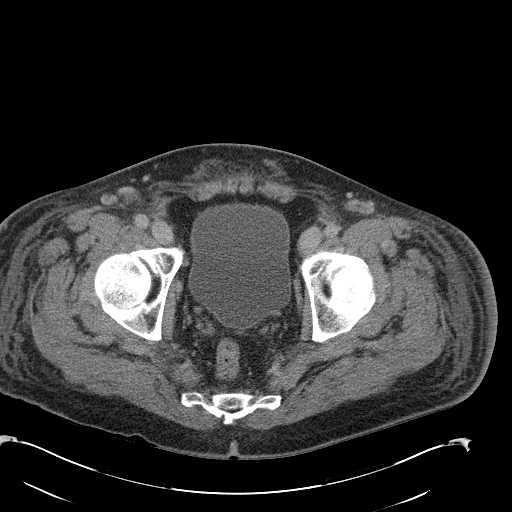
[im 28/97  soft-tissue]
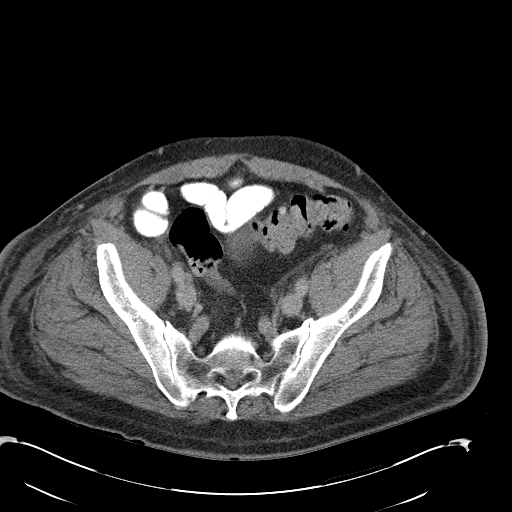
[im 33/97  soft-tissue]
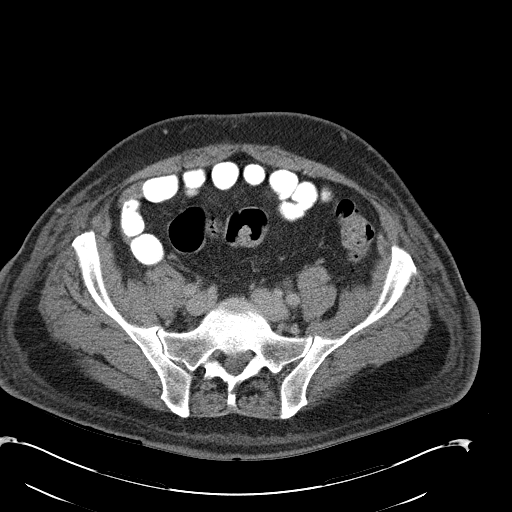
[im 37/97  soft-tissue]
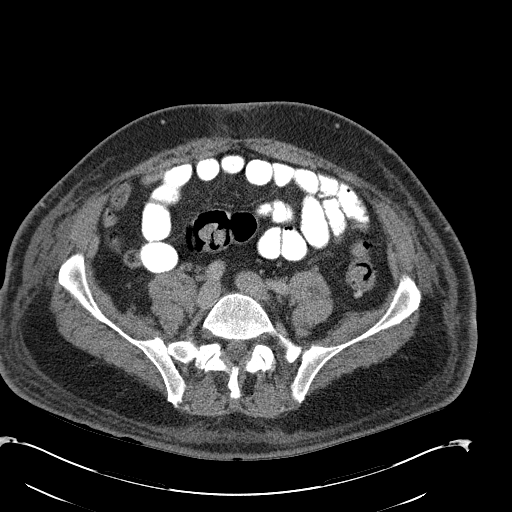
[im 46/97  soft-tissue]
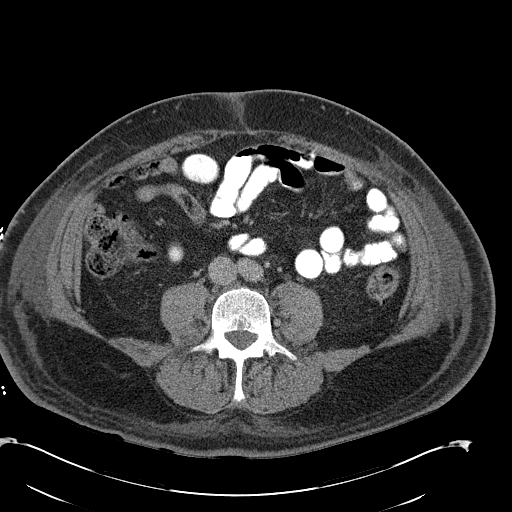
[im 51/97  soft-tissue]
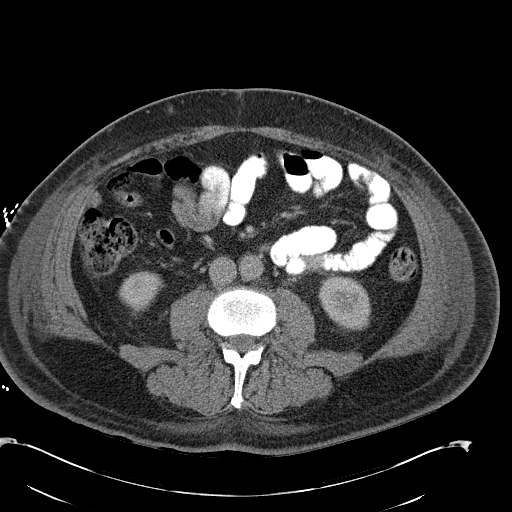
[im 60/97  soft-tissue]
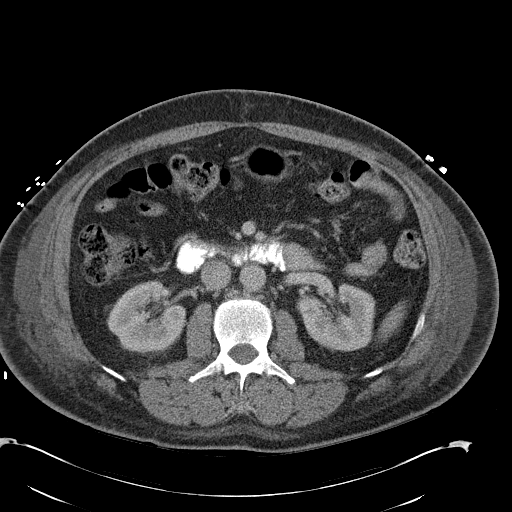
[im 60/97  bone]
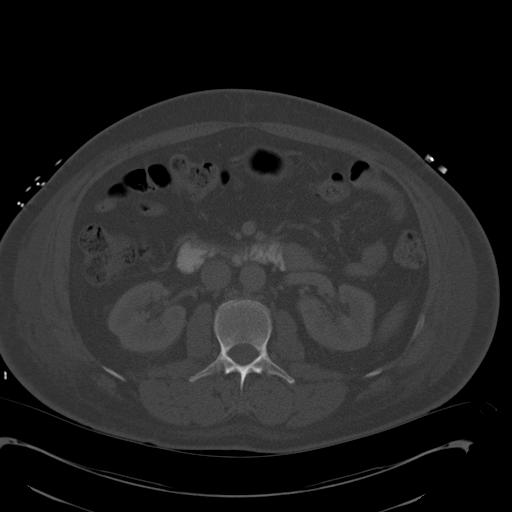
[im 65/97  soft-tissue]
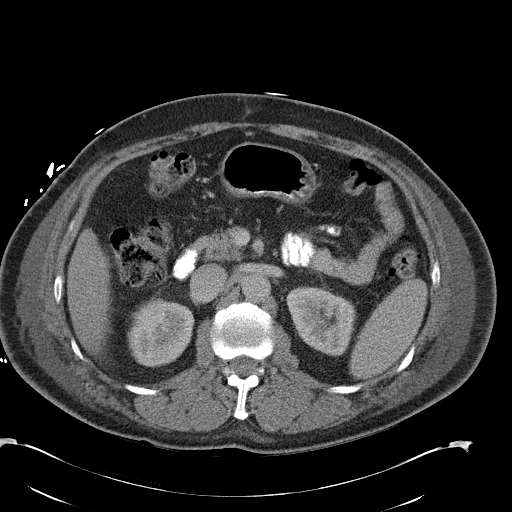
[im 74/97  soft-tissue]
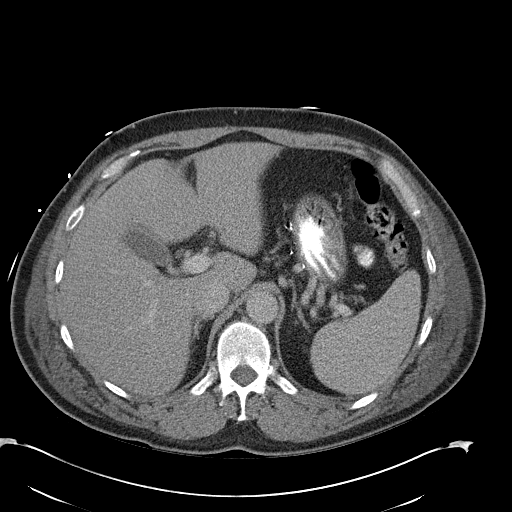
[im 78/97  soft-tissue]
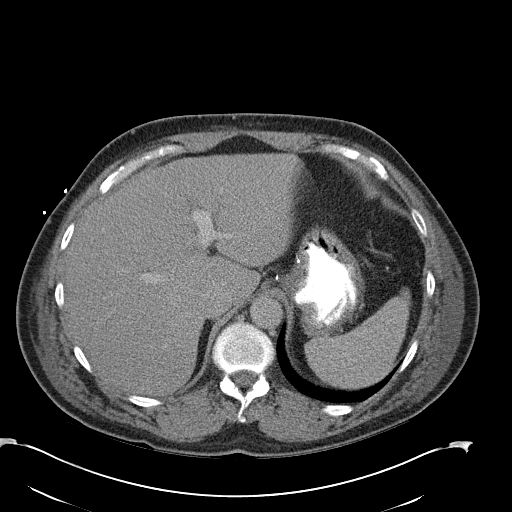
[im 83/97  soft-tissue]
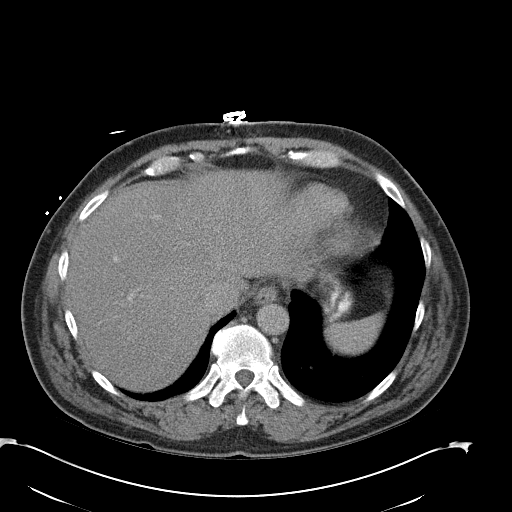
[im 92/97  soft-tissue]
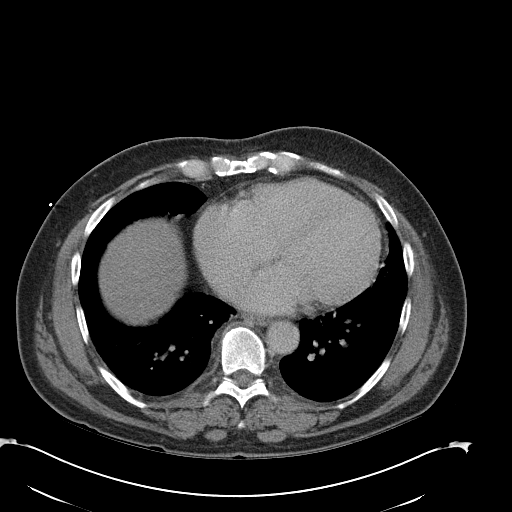

[Series 4: abd_pel_with 3.0 spo cor · coronal · 0.93mm/px · 3 of 102 slices shown]
[im 34/102  soft-tissue]
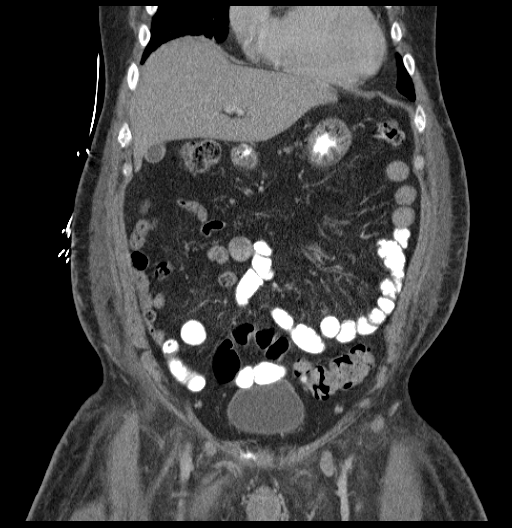
[im 45/102  soft-tissue]
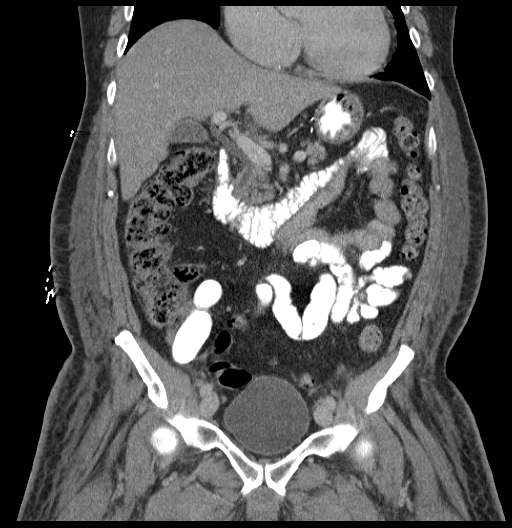
[im 57/102  soft-tissue]
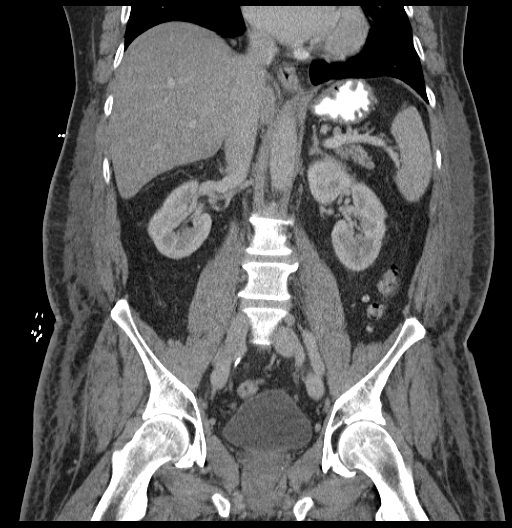

[17 of 46 positions shown; findings below may reference images not displayed]

FINDINGS: The lung bases are free of acute infiltrate or sizable effusion. The
previously seen changes have resolved in the interval.

The liver is diffusely fatty infiltrated. The gallbladder is well
distended without focal abnormality. The spleen, adrenal glands and
pancreas are unremarkable. The previously seen inflammatory changes
surrounding the head of the pancreas and duodenum have resolved in
the interval from the prior exam. The kidneys are within normal
limits bilaterally. Delayed images demonstrate normal excretion of
contrast bilaterally.

The appendix is not well visualized. No inflammatory changes to
suggest appendicitis are noted. Diffuse diverticular change is noted
without diverticulitis. The bladder is well distended. No pelvic
mass lesion or sidewall abnormality is seen. The osseous structures
show no acute abnormality. Mild soft tissue edematous changes are
noted which may be related to some 3rd spacing. This is new from the
prior exam.
IMPRESSION: Resolution of previously seen peripancreatic and periduodenal
inflammatory changes.

New soft tissue edematous changes of the abdominal wall

Chronic changes as described.

## 2014-04-05 ENCOUNTER — Other Ambulatory Visit: Payer: Self-pay | Admitting: Cardiovascular Disease

## 2014-04-06 ENCOUNTER — Other Ambulatory Visit: Payer: Self-pay | Admitting: *Deleted

## 2014-04-06 MED ORDER — METOPROLOL SUCCINATE ER 100 MG PO TB24: ORAL_TABLET | ORAL | Status: DC

## 2014-04-09 ENCOUNTER — Other Ambulatory Visit: Payer: Self-pay | Admitting: Cardiovascular Disease

## 2014-04-10 NOTE — Telephone Encounter (Signed)
Refilled on 04/06/14

## 2014-04-13 ENCOUNTER — Telehealth: Payer: Self-pay | Admitting: *Deleted

## 2014-04-13 NOTE — Telephone Encounter (Signed)
Call and confirmed appointment change with patient

## 2014-05-07 ENCOUNTER — Telehealth: Payer: Self-pay | Admitting: Neurology

## 2014-05-07 ENCOUNTER — Ambulatory Visit: Payer: Self-pay | Admitting: Neurology

## 2014-05-07 NOTE — Telephone Encounter (Signed)
Patient is a no show for today's appointment at 10:00 05/07/14

## 2014-05-08 ENCOUNTER — Encounter: Payer: Self-pay | Admitting: Neurology

## 2014-08-22 ENCOUNTER — Other Ambulatory Visit: Payer: Self-pay | Admitting: *Deleted

## 2014-08-22 MED ORDER — FUROSEMIDE 40 MG PO TABS: 40.0000 mg | ORAL_TABLET | Freq: Every day | ORAL | Status: AC

## 2014-08-22 MED ORDER — POTASSIUM CHLORIDE CRYS ER 20 MEQ PO TBCR: 20.0000 meq | EXTENDED_RELEASE_TABLET | Freq: Every day | ORAL | Status: AC

## 2014-08-22 MED ORDER — DILTIAZEM HCL ER COATED BEADS 240 MG PO CP24: 240.0000 mg | ORAL_CAPSULE | Freq: Every day | ORAL | Status: DC

## 2014-09-09 IMAGING — CR DG ORBITS FOR FOREIGN BODY
2 series · 2 of 2 positions shown · non-contrast
Comparison: Head CT 02/24/2013.

CLINICAL DATA: Metal working/exposure; clearance prior to MRI

EXAM:
ORBITS FOR FOREIGN BODY - 2 VIEW

[view not recorded (1 of 2)]
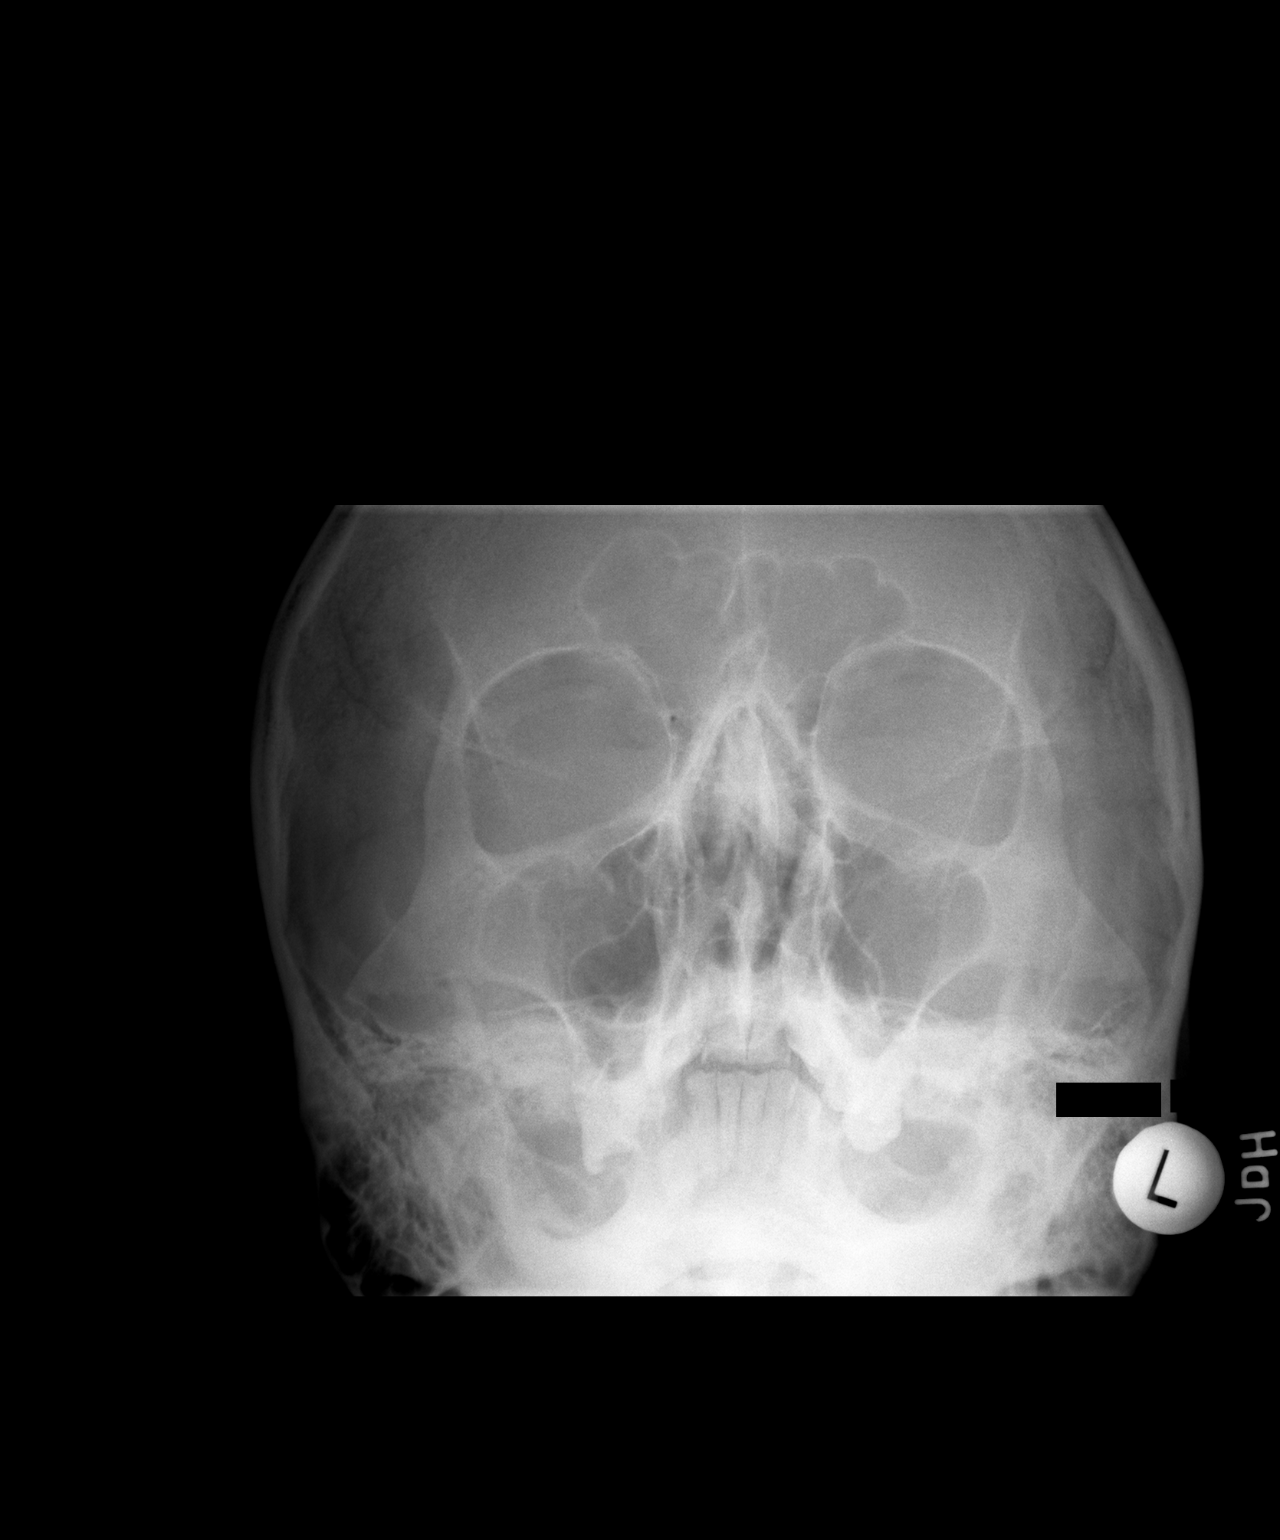

[view not recorded (2 of 2)]
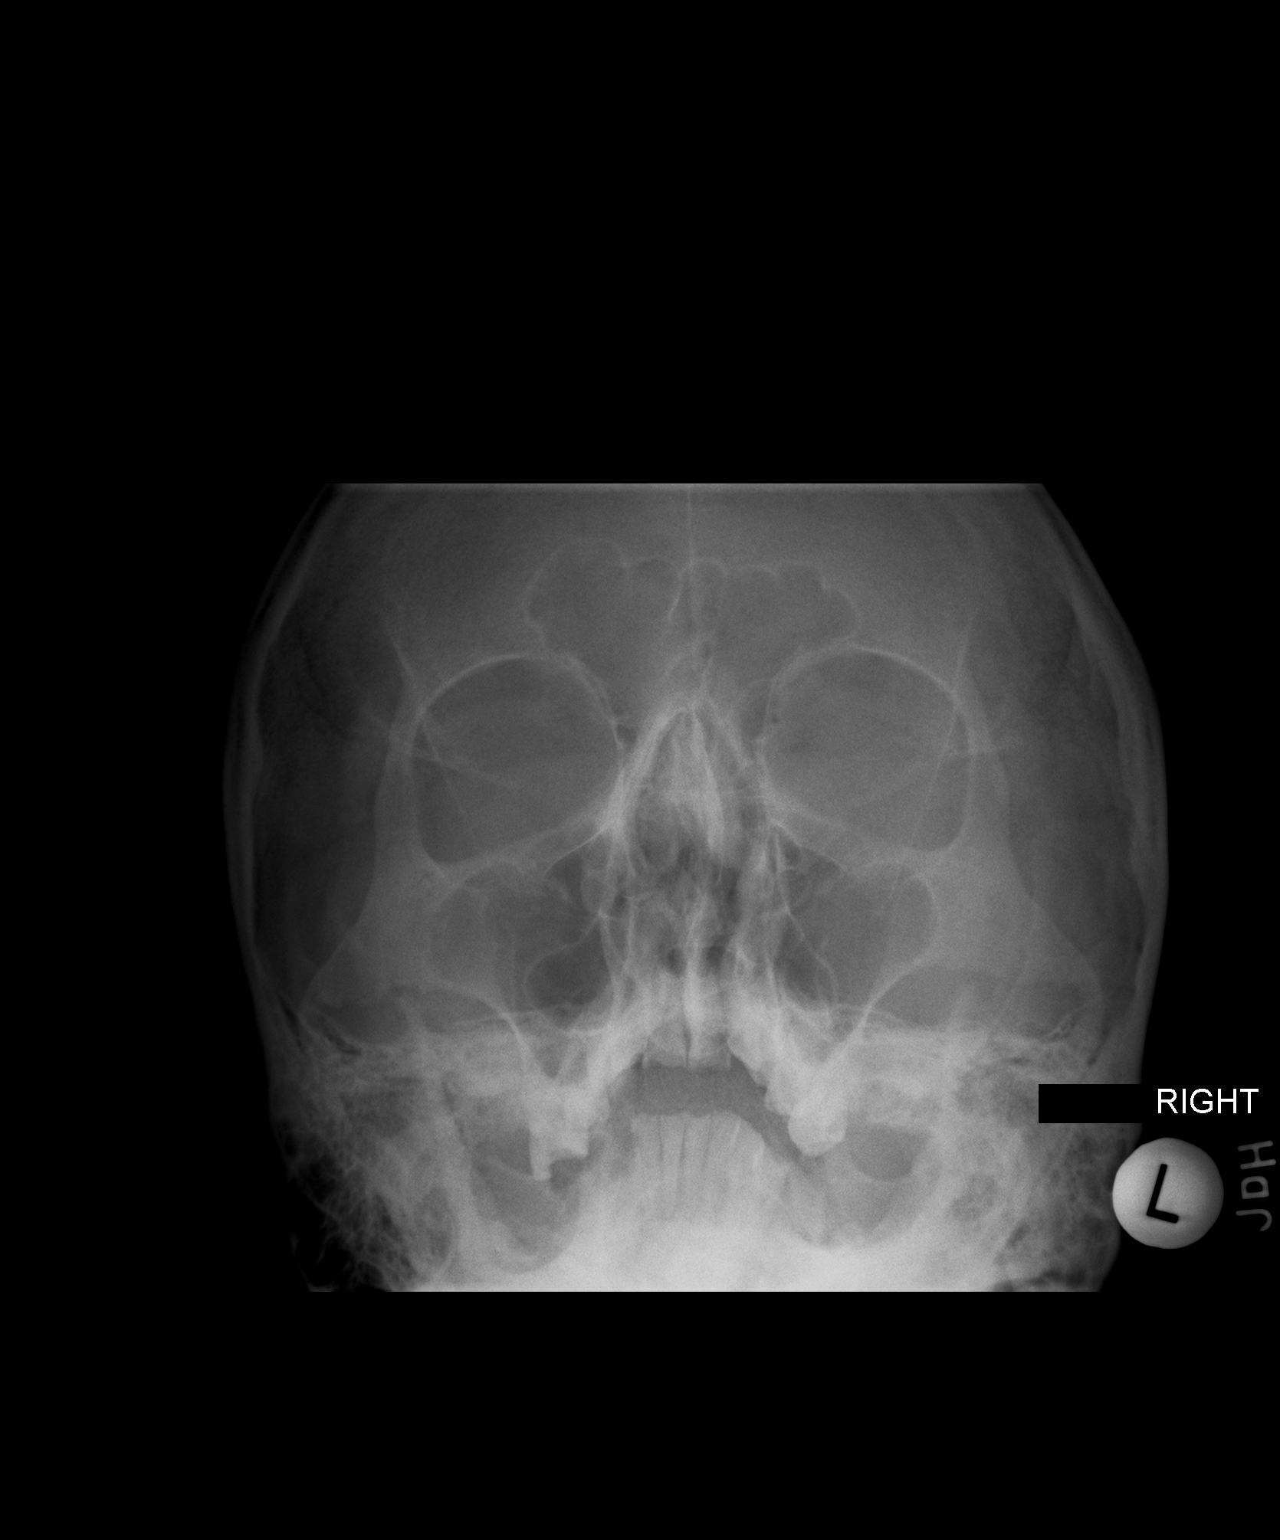

[2 of 2 positions shown; findings below may reference images not displayed]

FINDINGS: There is no evidence of metallic foreign body within the orbits. No
significant bone abnormality identified.
IMPRESSION: No evidence of metallic foreign body within the orbits.

## 2015-05-30 ENCOUNTER — Encounter: Payer: Self-pay | Admitting: Cardiovascular Disease

## 2015-05-30 ENCOUNTER — Ambulatory Visit (INDEPENDENT_AMBULATORY_CARE_PROVIDER_SITE_OTHER): Payer: Medicare Other | Admitting: Cardiovascular Disease

## 2015-05-30 VITALS — BP 130/90 | HR 140 | Ht 72.0 in | Wt 154.0 lb

## 2015-05-30 DIAGNOSIS — F329 Major depressive disorder, single episode, unspecified: Secondary | ICD-10-CM | POA: Diagnosis not present

## 2015-05-30 DIAGNOSIS — I4891 Unspecified atrial fibrillation: Secondary | ICD-10-CM

## 2015-05-30 DIAGNOSIS — I10 Essential (primary) hypertension: Secondary | ICD-10-CM | POA: Diagnosis not present

## 2015-05-30 DIAGNOSIS — I5022 Chronic systolic (congestive) heart failure: Secondary | ICD-10-CM

## 2015-05-30 DIAGNOSIS — I429 Cardiomyopathy, unspecified: Secondary | ICD-10-CM

## 2015-05-30 DIAGNOSIS — G894 Chronic pain syndrome: Secondary | ICD-10-CM | POA: Diagnosis not present

## 2015-05-30 DIAGNOSIS — F32A Depression, unspecified: Secondary | ICD-10-CM

## 2015-05-30 DIAGNOSIS — Z9114 Patient's other noncompliance with medication regimen: Secondary | ICD-10-CM

## 2015-05-30 MED ORDER — METOPROLOL SUCCINATE ER 100 MG PO TB24: ORAL_TABLET | ORAL | Status: AC

## 2015-05-30 MED ORDER — DILTIAZEM HCL ER COATED BEADS 240 MG PO CP24: 240.0000 mg | ORAL_CAPSULE | Freq: Every day | ORAL | Status: AC

## 2015-05-30 NOTE — Progress Notes (Signed)
Patient ID: Seth Newton, male   DOB: November 21, 1965, 50 y.o.   MRN: [id number]      SUBJECTIVE: The patient is here to followup for atrial fibrillation and cardiomyopathy. Previous nuclear stress testing did not demonstrate any evidence of ischemia.  Has chronic pain syndrome.  Has not taken cardiac meds in over 2 months. He proceeded again to tell me about the pain he is experiencing from his head to his toes. He again requests oxycodone. ECG shows rapid atrial fibrillation heart rate 158 bpm with PVCs and nonspecific ST segment abnormalities.    Review of Systems: As per "subjective", otherwise negative.  Allergies  Allergen Reactions  . Gabapentin Other (See Comments)    Unknown   . Penicillins Rash    Current Outpatient Prescriptions  Medication Sig Dispense Refill  . aspirin EC 81 MG tablet Take 81 mg by mouth daily.    Marland Kitchen diltiazem (DILTIAZEM CD) 240 MG 24 hr capsule Take 1 capsule (240 mg total) by mouth daily. 30 capsule 6  . docusate sodium (COLACE) 100 MG capsule Take 100 mg by mouth daily.    . folic acid (FOLVITE) 1 MG tablet TAKE 1 TABLET BY MOUTH EVERY DAY 30 tablet 11  . furosemide (LASIX) 40 MG tablet Take 1 tablet (40 mg total) by mouth daily. 30 tablet 6  . metoprolol succinate (TOPROL-XL) 100 MG 24 hr tablet Take 1.5 tablets by mouth daily. 135 tablet 3  . Multiple Vitamin (MULTIVITAMIN WITH MINERALS) TABS tablet Take 1 tablet by mouth daily.    . ondansetron (ZOFRAN) 4 MG tablet Take 1 tablet (4 mg total) by mouth every 8 (eight) hours as needed for nausea or vomiting. 20 tablet 0  . Oxycodone HCl 10 MG TABS Take 1 tablet (10 mg total) by mouth every 6 (six) hours as needed. 30 tablet 0  . oxymetazoline (AFRIN) 0.05 % nasal spray Place 2 sprays into the nose 2 (two) times daily as needed for congestion.    . pantoprazole (PROTONIX) 40 MG tablet TAKE 1 TABLET BY MOUTH EVERY DAY 30 tablet 11  . potassium chloride SA (K-DUR,KLOR-CON) 20 MEQ tablet Take 1 tablet (20 mEq  total) by mouth daily. 30 tablet 6  . thiamine 100 MG tablet Take 100 mg by mouth daily.    Marland Kitchen zolpidem (AMBIEN) 5 MG tablet Take 1 tablet (5 mg total) by mouth at bedtime as needed for sleep. 14 tablet 1   No current facility-administered medications for this visit.    Past Medical History  Diagnosis Date  . Seizures (HCC)   . Chronic back pain   . Chronic neck pain   . Essential hypertension, benign   . A-fib (HCC)   . Hepatitis C   . Chronic systolic congestive heart failure (HCC) 03/12/2013  . Atrial fibrillation (HCC) 03/12/2013  . Pancreatitis   . Alcohol abuse     Past Surgical History  Procedure Laterality Date  . Back surgery      neck  . Abdominal surgery      for gastric ulcers?    Social History   Social History  . Marital Status: Divorced    Spouse Name: N/A  . Number of Children: 2  . Years of Education: 12th   Occupational History  . n/A    Social History Main Topics  . Smoking status: Never Smoker   . Smokeless tobacco: Never Used  . Alcohol Use: No  . Drug Use: No  . Sexual Activity: Yes  Other Topics Concern  . Not on file   Social History Narrative   Patient lives at home with his mother he is right handed.   Patient drinks caffinated drinks on OCC.     Filed Vitals:   05/30/15 1253  BP: 130/90  Pulse: 140  Height: 6' (1.829 m)  Weight: 154 lb (69.854 kg)  SpO2: 96%    PHYSICAL EXAM General: NAD, thin build HEENT: Normal. Neck: No JVD, no thyromegaly. Lungs: Clear to auscultation bilaterally with normal respiratory effort. CV: Tachycardic, irregular rhythm, normal S1/S2, no S3, no murmur. No pretibial or periankle edema.   Abdomen: Soft, nontender, no distention.  Neurologic: Alert and oriented.  Psych: Anxious, flat affect. Skin: Normal. Musculoskeletal: No gross deformities.  ECG: Most recent ECG reviewed.      ASSESSMENT AND PLAN: 1. Rapid atrial fibrillation: Has not taken AV nodal blocking agents in over 2  months. He is not in heart failure at present. No plans for cardioversion at present. Given prior h/o falls with increased risk for head trauma, h/o noncompliance, and prior h/o alcoholism, not a suitable anticoagulation. I will refill long-acting diltiazem 240 mg daily and metoprolol succinate 150 mg daily.  Will have him return in 1 month for an ECG (nurse visit).  2. Chronic systolic heart failure/cardiomyopathy: Nuclear stress testing revealed normal myocardial perfusion, thus effectively ruling out ischemia as a potential etiology. Encouraged to take metoprolol and Lasix. Given his h/o episodic lightheadedness and dizziness (orthostasis) along with relative hypotension and medication noncompliance, will hold off on addition of ACEI or spironolactone at present. EF 45-50%, increased from 30%. Had been 60-65% in 08/2012. Currently euvolemic.  3. Essential HTN: Reasonably controlled. Monitor given reintroduction of metoprolol and diltiazem.  4. Depression/chronic pain syndrome: Will make referrals to psychiatry and pain management.  Dispo: f/u 1 year with APP.  Prentice Docker, M.D., F.A.C.C.

## 2015-05-30 NOTE — Patient Instructions (Signed)
   Refills for your Metoprolol & Diltiazem sent to [facility] today for 90 day supply with 3 refills. Continue all other medications.   Referral to psychiatry  Referral for pain management 1 month nurse visit for EKG  Your physician wants you to follow up in:  1 year.  You will receive a reminder letter in the mail one-two months in advance.  If you don't receive a letter, please call our office to schedule the follow up appointment

## 2015-06-26 ENCOUNTER — Other Ambulatory Visit: Payer: Self-pay | Admitting: *Deleted

## 2015-06-26 DIAGNOSIS — G8929 Other chronic pain: Secondary | ICD-10-CM

## 2015-07-30 ENCOUNTER — Ambulatory Visit (HOSPITAL_COMMUNITY): Payer: Medicare Other | Admitting: Psychiatry

## 2016-01-14 DIAGNOSIS — B1920 Unspecified viral hepatitis C without hepatic coma: Secondary | ICD-10-CM | POA: Diagnosis not present

## 2016-01-14 DIAGNOSIS — I4891 Unspecified atrial fibrillation: Secondary | ICD-10-CM | POA: Diagnosis not present

## 2016-01-14 DIAGNOSIS — G894 Chronic pain syndrome: Secondary | ICD-10-CM | POA: Diagnosis not present

## 2016-01-14 DIAGNOSIS — R079 Chest pain, unspecified: Secondary | ICD-10-CM | POA: Diagnosis not present

## 2016-01-14 DIAGNOSIS — F411 Generalized anxiety disorder: Secondary | ICD-10-CM | POA: Diagnosis not present

## 2016-01-14 DIAGNOSIS — Z7982 Long term (current) use of aspirin: Secondary | ICD-10-CM | POA: Diagnosis not present

## 2016-01-14 DIAGNOSIS — M549 Dorsalgia, unspecified: Secondary | ICD-10-CM | POA: Diagnosis not present

## 2016-01-14 DIAGNOSIS — F419 Anxiety disorder, unspecified: Secondary | ICD-10-CM | POA: Diagnosis not present

## 2016-01-14 DIAGNOSIS — F458 Other somatoform disorders: Secondary | ICD-10-CM | POA: Diagnosis not present

## 2016-01-14 DIAGNOSIS — Z880 Allergy status to penicillin: Secondary | ICD-10-CM | POA: Diagnosis not present

## 2016-01-14 DIAGNOSIS — R634 Abnormal weight loss: Secondary | ICD-10-CM | POA: Diagnosis not present

## 2016-01-14 DIAGNOSIS — Z888 Allergy status to other drugs, medicaments and biological substances status: Secondary | ICD-10-CM | POA: Diagnosis not present

## 2016-01-14 DIAGNOSIS — Z79899 Other long term (current) drug therapy: Secondary | ICD-10-CM | POA: Diagnosis not present

## 2016-01-14 DIAGNOSIS — G8929 Other chronic pain: Secondary | ICD-10-CM | POA: Diagnosis not present

## 2016-01-14 DIAGNOSIS — M542 Cervicalgia: Secondary | ICD-10-CM | POA: Diagnosis not present

## 2016-01-14 DIAGNOSIS — K279 Peptic ulcer, site unspecified, unspecified as acute or chronic, without hemorrhage or perforation: Secondary | ICD-10-CM | POA: Diagnosis not present

## 2016-01-14 DIAGNOSIS — I482 Chronic atrial fibrillation: Secondary | ICD-10-CM | POA: Diagnosis not present

## 2016-01-14 DIAGNOSIS — I480 Paroxysmal atrial fibrillation: Secondary | ICD-10-CM | POA: Diagnosis not present

## 2016-01-14 DIAGNOSIS — M79606 Pain in leg, unspecified: Secondary | ICD-10-CM | POA: Diagnosis not present

## 2016-01-14 DIAGNOSIS — I429 Cardiomyopathy, unspecified: Secondary | ICD-10-CM | POA: Diagnosis not present

## 2016-01-15 DIAGNOSIS — R079 Chest pain, unspecified: Secondary | ICD-10-CM | POA: Diagnosis not present

## 2016-01-16 DIAGNOSIS — R079 Chest pain, unspecified: Secondary | ICD-10-CM | POA: Diagnosis not present

## 2016-01-16 DIAGNOSIS — I4891 Unspecified atrial fibrillation: Secondary | ICD-10-CM | POA: Diagnosis not present

## 2016-04-24 DIAGNOSIS — F419 Anxiety disorder, unspecified: Secondary | ICD-10-CM | POA: Diagnosis not present

## 2016-04-24 DIAGNOSIS — I4891 Unspecified atrial fibrillation: Secondary | ICD-10-CM | POA: Diagnosis not present

## 2016-04-24 DIAGNOSIS — Z79899 Other long term (current) drug therapy: Secondary | ICD-10-CM | POA: Diagnosis not present

## 2016-04-24 DIAGNOSIS — Z7982 Long term (current) use of aspirin: Secondary | ICD-10-CM | POA: Diagnosis not present

## 2016-05-18 DIAGNOSIS — I4891 Unspecified atrial fibrillation: Secondary | ICD-10-CM | POA: Diagnosis not present

## 2016-05-18 DIAGNOSIS — Z7982 Long term (current) use of aspirin: Secondary | ICD-10-CM | POA: Diagnosis not present

## 2016-05-18 DIAGNOSIS — F419 Anxiety disorder, unspecified: Secondary | ICD-10-CM | POA: Diagnosis not present

## 2016-05-18 DIAGNOSIS — R079 Chest pain, unspecified: Secondary | ICD-10-CM | POA: Diagnosis not present

## 2016-05-18 DIAGNOSIS — Z79899 Other long term (current) drug therapy: Secondary | ICD-10-CM | POA: Diagnosis not present

## 2016-05-20 DIAGNOSIS — Z79899 Other long term (current) drug therapy: Secondary | ICD-10-CM | POA: Diagnosis not present

## 2016-05-20 DIAGNOSIS — S0990XA Unspecified injury of head, initial encounter: Secondary | ICD-10-CM | POA: Diagnosis not present

## 2016-05-20 DIAGNOSIS — S064X0A Epidural hemorrhage without loss of consciousness, initial encounter: Secondary | ICD-10-CM | POA: Diagnosis not present

## 2016-05-20 DIAGNOSIS — M542 Cervicalgia: Secondary | ICD-10-CM | POA: Diagnosis not present

## 2016-05-20 DIAGNOSIS — S0081XA Abrasion of other part of head, initial encounter: Secondary | ICD-10-CM | POA: Diagnosis not present

## 2016-05-20 DIAGNOSIS — T148XXA Other injury of unspecified body region, initial encounter: Secondary | ICD-10-CM | POA: Diagnosis not present

## 2016-05-20 DIAGNOSIS — S161XXA Strain of muscle, fascia and tendon at neck level, initial encounter: Secondary | ICD-10-CM | POA: Diagnosis not present

## 2016-05-20 DIAGNOSIS — Z7982 Long term (current) use of aspirin: Secondary | ICD-10-CM | POA: Diagnosis not present

## 2016-05-20 DIAGNOSIS — R569 Unspecified convulsions: Secondary | ICD-10-CM | POA: Diagnosis not present

## 2016-05-23 DIAGNOSIS — R569 Unspecified convulsions: Secondary | ICD-10-CM | POA: Diagnosis not present

## 2016-05-23 DIAGNOSIS — Z880 Allergy status to penicillin: Secondary | ICD-10-CM | POA: Diagnosis not present

## 2016-05-23 DIAGNOSIS — I959 Hypotension, unspecified: Secondary | ICD-10-CM | POA: Diagnosis not present

## 2016-05-23 DIAGNOSIS — I4891 Unspecified atrial fibrillation: Secondary | ICD-10-CM | POA: Diagnosis not present

## 2016-05-23 DIAGNOSIS — M25512 Pain in left shoulder: Secondary | ICD-10-CM | POA: Diagnosis not present

## 2016-05-23 DIAGNOSIS — Z7982 Long term (current) use of aspirin: Secondary | ICD-10-CM | POA: Diagnosis not present

## 2016-05-23 DIAGNOSIS — Z888 Allergy status to other drugs, medicaments and biological substances status: Secondary | ICD-10-CM | POA: Diagnosis not present

## 2016-05-23 DIAGNOSIS — Z79899 Other long term (current) drug therapy: Secondary | ICD-10-CM | POA: Diagnosis not present

## 2016-05-23 DIAGNOSIS — I482 Chronic atrial fibrillation: Secondary | ICD-10-CM | POA: Diagnosis not present

## 2016-06-04 ENCOUNTER — Encounter: Payer: Self-pay | Admitting: *Deleted

## 2016-06-05 ENCOUNTER — Ambulatory Visit: Payer: Medicare Other | Admitting: Cardiovascular Disease

## 2017-02-02 DIAGNOSIS — R079 Chest pain, unspecified: Secondary | ICD-10-CM | POA: Diagnosis not present

## 2017-02-02 DIAGNOSIS — R0602 Shortness of breath: Secondary | ICD-10-CM | POA: Diagnosis not present

## 2017-02-02 DIAGNOSIS — I4891 Unspecified atrial fibrillation: Secondary | ICD-10-CM | POA: Diagnosis not present

## 2017-02-03 ENCOUNTER — Encounter: Payer: Self-pay | Admitting: Cardiovascular Disease

## 2017-02-03 ENCOUNTER — Telehealth: Payer: Self-pay | Admitting: *Deleted

## 2017-02-03 NOTE — Telephone Encounter (Signed)
[facility] doctor says pt presenting with Afib w/RVR - says pt had alcohol in system and pt said we had seen him in the last 3 months [place] and that we would RF Klonopin. We have not in fact seen pt since 05/2015. Gave physician the last medications we had pt taking including Toprol XL 150 mg daily and diltiazem 240mg  daily. Also explained that we referred pt to pain management and psychiatry and pt was supposed to have f/u in 05/2016.  Physician said pt came in with Toprol XL 200 mg daily and would start diltiazem 180 mg since pt has not been taking this. Would also fax notes when completed so that we could attempt to make f/u appt.

## 2017-02-24 ENCOUNTER — Ambulatory Visit: Payer: Medicare Other | Admitting: Cardiovascular Disease

## 2017-04-21 DIAGNOSIS — R7989 Other specified abnormal findings of blood chemistry: Secondary | ICD-10-CM | POA: Diagnosis not present

## 2017-04-21 DIAGNOSIS — R079 Chest pain, unspecified: Secondary | ICD-10-CM | POA: Diagnosis not present

## 2017-04-21 DIAGNOSIS — M549 Dorsalgia, unspecified: Secondary | ICD-10-CM | POA: Diagnosis not present

## 2017-04-21 DIAGNOSIS — Z8711 Personal history of peptic ulcer disease: Secondary | ICD-10-CM | POA: Diagnosis not present

## 2017-04-21 DIAGNOSIS — G40909 Epilepsy, unspecified, not intractable, without status epilepticus: Secondary | ICD-10-CM | POA: Diagnosis not present

## 2017-04-21 DIAGNOSIS — R42 Dizziness and giddiness: Secondary | ICD-10-CM | POA: Diagnosis not present

## 2017-04-21 DIAGNOSIS — F411 Generalized anxiety disorder: Secondary | ICD-10-CM | POA: Diagnosis not present

## 2017-04-21 DIAGNOSIS — Z886 Allergy status to analgesic agent status: Secondary | ICD-10-CM | POA: Diagnosis not present

## 2017-04-21 DIAGNOSIS — I4891 Unspecified atrial fibrillation: Secondary | ICD-10-CM | POA: Diagnosis not present

## 2017-04-21 DIAGNOSIS — K219 Gastro-esophageal reflux disease without esophagitis: Secondary | ICD-10-CM | POA: Diagnosis not present

## 2017-04-21 DIAGNOSIS — G894 Chronic pain syndrome: Secondary | ICD-10-CM | POA: Diagnosis not present

## 2017-04-21 DIAGNOSIS — I480 Paroxysmal atrial fibrillation: Secondary | ICD-10-CM | POA: Diagnosis not present

## 2017-04-21 DIAGNOSIS — N200 Calculus of kidney: Secondary | ICD-10-CM | POA: Diagnosis not present

## 2017-04-21 DIAGNOSIS — Z8619 Personal history of other infectious and parasitic diseases: Secondary | ICD-10-CM | POA: Diagnosis not present

## 2017-04-21 DIAGNOSIS — R0602 Shortness of breath: Secondary | ICD-10-CM | POA: Diagnosis not present

## 2017-04-21 DIAGNOSIS — Z880 Allergy status to penicillin: Secondary | ICD-10-CM | POA: Diagnosis not present

## 2017-04-21 DIAGNOSIS — R200 Anesthesia of skin: Secondary | ICD-10-CM | POA: Diagnosis not present

## 2017-04-21 DIAGNOSIS — Z7982 Long term (current) use of aspirin: Secondary | ICD-10-CM | POA: Diagnosis not present

## 2017-04-22 DIAGNOSIS — I480 Paroxysmal atrial fibrillation: Secondary | ICD-10-CM | POA: Diagnosis not present

## 2017-04-22 DIAGNOSIS — F411 Generalized anxiety disorder: Secondary | ICD-10-CM | POA: Diagnosis not present

## 2017-04-22 DIAGNOSIS — G894 Chronic pain syndrome: Secondary | ICD-10-CM | POA: Diagnosis not present

## 2017-04-22 DIAGNOSIS — R7989 Other specified abnormal findings of blood chemistry: Secondary | ICD-10-CM | POA: Diagnosis not present

## 2017-05-12 DIAGNOSIS — M542 Cervicalgia: Secondary | ICD-10-CM | POA: Diagnosis not present

## 2017-05-12 DIAGNOSIS — I4891 Unspecified atrial fibrillation: Secondary | ICD-10-CM | POA: Diagnosis not present

## 2017-05-12 DIAGNOSIS — Z8711 Personal history of peptic ulcer disease: Secondary | ICD-10-CM | POA: Diagnosis not present

## 2017-05-12 DIAGNOSIS — R079 Chest pain, unspecified: Secondary | ICD-10-CM | POA: Diagnosis not present

## 2017-05-12 DIAGNOSIS — G894 Chronic pain syndrome: Secondary | ICD-10-CM | POA: Diagnosis not present

## 2017-05-12 DIAGNOSIS — Z8659 Personal history of other mental and behavioral disorders: Secondary | ICD-10-CM | POA: Diagnosis not present

## 2017-05-12 DIAGNOSIS — R9431 Abnormal electrocardiogram [ECG] [EKG]: Secondary | ICD-10-CM | POA: Diagnosis not present

## 2017-05-12 DIAGNOSIS — Z8679 Personal history of other diseases of the circulatory system: Secondary | ICD-10-CM | POA: Diagnosis not present

## 2017-05-23 DIAGNOSIS — R531 Weakness: Secondary | ICD-10-CM | POA: Diagnosis not present

## 2017-05-23 DIAGNOSIS — I4891 Unspecified atrial fibrillation: Secondary | ICD-10-CM | POA: Diagnosis not present

## 2017-05-23 DIAGNOSIS — R197 Diarrhea, unspecified: Secondary | ICD-10-CM | POA: Diagnosis not present

## 2017-05-23 DIAGNOSIS — I482 Chronic atrial fibrillation: Secondary | ICD-10-CM | POA: Diagnosis not present

## 2017-05-23 DIAGNOSIS — R079 Chest pain, unspecified: Secondary | ICD-10-CM | POA: Diagnosis not present

## 2017-05-23 DIAGNOSIS — R0989 Other specified symptoms and signs involving the circulatory and respiratory systems: Secondary | ICD-10-CM | POA: Diagnosis not present

## 2017-05-23 DIAGNOSIS — Z7982 Long term (current) use of aspirin: Secondary | ICD-10-CM | POA: Diagnosis not present

## 2017-05-23 DIAGNOSIS — Z79899 Other long term (current) drug therapy: Secondary | ICD-10-CM | POA: Diagnosis not present

## 2017-05-23 DIAGNOSIS — R112 Nausea with vomiting, unspecified: Secondary | ICD-10-CM | POA: Diagnosis not present

## 2017-05-24 DIAGNOSIS — Z8679 Personal history of other diseases of the circulatory system: Secondary | ICD-10-CM | POA: Diagnosis not present

## 2017-05-24 DIAGNOSIS — M542 Cervicalgia: Secondary | ICD-10-CM | POA: Diagnosis not present

## 2017-05-27 DIAGNOSIS — G8929 Other chronic pain: Secondary | ICD-10-CM | POA: Diagnosis not present

## 2017-05-27 DIAGNOSIS — M542 Cervicalgia: Secondary | ICD-10-CM | POA: Diagnosis not present

## 2017-05-27 DIAGNOSIS — Z79899 Other long term (current) drug therapy: Secondary | ICD-10-CM | POA: Diagnosis not present

## 2017-05-27 DIAGNOSIS — M25512 Pain in left shoulder: Secondary | ICD-10-CM | POA: Diagnosis not present

## 2017-05-27 DIAGNOSIS — M5442 Lumbago with sciatica, left side: Secondary | ICD-10-CM | POA: Diagnosis not present

## 2017-06-14 ENCOUNTER — Ambulatory Visit: Payer: Medicare Other | Admitting: Cardiovascular Disease

## 2017-06-14 DIAGNOSIS — R0989 Other specified symptoms and signs involving the circulatory and respiratory systems: Secondary | ICD-10-CM

## 2017-06-15 DIAGNOSIS — Z7982 Long term (current) use of aspirin: Secondary | ICD-10-CM | POA: Diagnosis not present

## 2017-06-15 DIAGNOSIS — I481 Persistent atrial fibrillation: Secondary | ICD-10-CM | POA: Diagnosis not present

## 2017-06-15 DIAGNOSIS — Z79899 Other long term (current) drug therapy: Secondary | ICD-10-CM | POA: Diagnosis not present

## 2017-06-15 DIAGNOSIS — I4891 Unspecified atrial fibrillation: Secondary | ICD-10-CM | POA: Diagnosis not present

## 2017-06-15 DIAGNOSIS — F5101 Primary insomnia: Secondary | ICD-10-CM | POA: Diagnosis not present

## 2017-06-15 DIAGNOSIS — R079 Chest pain, unspecified: Secondary | ICD-10-CM | POA: Diagnosis not present

## 2017-06-15 DIAGNOSIS — Z8679 Personal history of other diseases of the circulatory system: Secondary | ICD-10-CM | POA: Diagnosis not present

## 2017-06-15 DIAGNOSIS — R002 Palpitations: Secondary | ICD-10-CM | POA: Diagnosis not present

## 2017-06-15 DIAGNOSIS — R42 Dizziness and giddiness: Secondary | ICD-10-CM | POA: Diagnosis not present

## 2017-06-23 DIAGNOSIS — M5136 Other intervertebral disc degeneration, lumbar region: Secondary | ICD-10-CM | POA: Diagnosis not present

## 2017-06-23 DIAGNOSIS — M19012 Primary osteoarthritis, left shoulder: Secondary | ICD-10-CM | POA: Diagnosis not present

## 2017-06-23 DIAGNOSIS — M5030 Other cervical disc degeneration, unspecified cervical region: Secondary | ICD-10-CM | POA: Diagnosis not present

## 2017-06-23 DIAGNOSIS — Z79899 Other long term (current) drug therapy: Secondary | ICD-10-CM | POA: Diagnosis not present

## 2017-07-22 DIAGNOSIS — M19012 Primary osteoarthritis, left shoulder: Secondary | ICD-10-CM | POA: Diagnosis not present

## 2017-07-22 DIAGNOSIS — M5030 Other cervical disc degeneration, unspecified cervical region: Secondary | ICD-10-CM | POA: Diagnosis not present

## 2017-07-22 DIAGNOSIS — Z79899 Other long term (current) drug therapy: Secondary | ICD-10-CM | POA: Diagnosis not present

## 2017-07-22 DIAGNOSIS — M5136 Other intervertebral disc degeneration, lumbar region: Secondary | ICD-10-CM | POA: Diagnosis not present

## 2017-08-31 DIAGNOSIS — Z7982 Long term (current) use of aspirin: Secondary | ICD-10-CM | POA: Diagnosis not present

## 2017-08-31 DIAGNOSIS — Z79899 Other long term (current) drug therapy: Secondary | ICD-10-CM | POA: Diagnosis not present

## 2017-08-31 DIAGNOSIS — M5136 Other intervertebral disc degeneration, lumbar region: Secondary | ICD-10-CM | POA: Diagnosis not present

## 2017-08-31 DIAGNOSIS — J40 Bronchitis, not specified as acute or chronic: Secondary | ICD-10-CM | POA: Diagnosis not present

## 2017-08-31 DIAGNOSIS — I959 Hypotension, unspecified: Secondary | ICD-10-CM | POA: Diagnosis not present

## 2017-08-31 DIAGNOSIS — M5090 Cervical disc disorder, unspecified, unspecified cervical region: Secondary | ICD-10-CM | POA: Diagnosis not present

## 2017-08-31 DIAGNOSIS — E7800 Pure hypercholesterolemia, unspecified: Secondary | ICD-10-CM | POA: Diagnosis not present

## 2017-08-31 DIAGNOSIS — I4891 Unspecified atrial fibrillation: Secondary | ICD-10-CM | POA: Diagnosis not present

## 2017-11-15 DIAGNOSIS — Z79899 Other long term (current) drug therapy: Secondary | ICD-10-CM | POA: Diagnosis not present

## 2017-11-15 DIAGNOSIS — M5030 Other cervical disc degeneration, unspecified cervical region: Secondary | ICD-10-CM | POA: Diagnosis not present

## 2017-11-15 DIAGNOSIS — M5136 Other intervertebral disc degeneration, lumbar region: Secondary | ICD-10-CM | POA: Diagnosis not present

## 2017-11-15 DIAGNOSIS — Z6823 Body mass index (BMI) 23.0-23.9, adult: Secondary | ICD-10-CM | POA: Diagnosis not present

## 2017-11-30 ENCOUNTER — Telehealth: Payer: Self-pay | Admitting: Cardiovascular Disease

## 2017-11-30 NOTE — Telephone Encounter (Signed)
Numerous attempts to contact patient with recall letters. Unable to reach by telephone. with no success.  Seth Newton, [place] [[id number]] 05/30/2015 4:32 PM New [10]    [System] 01/29/2016 11:04 PM Notification Sent [20]   Seth Newton [[id number]] 05/27/2016 4:01 PM Scheduled/Linked [30]   [System] 06/09/2016 12:38 AM Notification Sent [20]   Seth Newton [[id number]] 08/12/2016 12:48 PM Notification Sent [20]   Seth Newton [[id number]] 08/12/2016 12:51 PM Notification Sent [20]   Seth Newton [[id number]] 02/03/2017 2:25 PM Scheduled/Linked [30]   [System] 02/28/2017 12:37 AM Notification Sent [20]   Seth Newton [[id number]] 05/13/2017 8:13 AM Scheduled/Linked [30]   [System] 06/18/2017 12:49 AM Notification Sent [

## 2018-02-12 DIAGNOSIS — M545 Low back pain: Secondary | ICD-10-CM | POA: Diagnosis not present

## 2018-02-12 DIAGNOSIS — M546 Pain in thoracic spine: Secondary | ICD-10-CM | POA: Diagnosis not present

## 2018-02-12 DIAGNOSIS — R29898 Other symptoms and signs involving the musculoskeletal system: Secondary | ICD-10-CM | POA: Diagnosis not present

## 2018-02-12 DIAGNOSIS — R202 Paresthesia of skin: Secondary | ICD-10-CM | POA: Diagnosis not present

## 2018-02-12 DIAGNOSIS — I5022 Chronic systolic (congestive) heart failure: Secondary | ICD-10-CM | POA: Diagnosis not present

## 2018-02-12 DIAGNOSIS — R52 Pain, unspecified: Secondary | ICD-10-CM | POA: Diagnosis not present

## 2018-02-12 DIAGNOSIS — M549 Dorsalgia, unspecified: Secondary | ICD-10-CM | POA: Diagnosis not present

## 2018-02-12 DIAGNOSIS — F05 Delirium due to known physiological condition: Secondary | ICD-10-CM | POA: Diagnosis not present

## 2018-02-12 DIAGNOSIS — G9520 Unspecified cord compression: Secondary | ICD-10-CM | POA: Diagnosis not present

## 2018-02-12 DIAGNOSIS — E7800 Pure hypercholesterolemia, unspecified: Secondary | ICD-10-CM | POA: Diagnosis not present

## 2018-02-12 DIAGNOSIS — I428 Other cardiomyopathies: Secondary | ICD-10-CM | POA: Diagnosis not present

## 2018-02-12 DIAGNOSIS — M5489 Other dorsalgia: Secondary | ICD-10-CM | POA: Diagnosis not present

## 2018-02-12 DIAGNOSIS — R269 Unspecified abnormalities of gait and mobility: Secondary | ICD-10-CM | POA: Diagnosis not present

## 2018-02-12 DIAGNOSIS — W19XXXA Unspecified fall, initial encounter: Secondary | ICD-10-CM | POA: Diagnosis not present

## 2018-02-12 DIAGNOSIS — M436 Torticollis: Secondary | ICD-10-CM | POA: Diagnosis not present

## 2018-02-12 DIAGNOSIS — Z7982 Long term (current) use of aspirin: Secondary | ICD-10-CM | POA: Diagnosis not present

## 2018-02-12 DIAGNOSIS — R531 Weakness: Secondary | ICD-10-CM | POA: Diagnosis not present

## 2018-02-12 DIAGNOSIS — E871 Hypo-osmolality and hyponatremia: Secondary | ICD-10-CM | POA: Diagnosis not present

## 2018-02-12 DIAGNOSIS — R200 Anesthesia of skin: Secondary | ICD-10-CM | POA: Diagnosis not present

## 2018-02-12 DIAGNOSIS — S22082A Unstable burst fracture of T11-T12 vertebra, initial encounter for closed fracture: Secondary | ICD-10-CM | POA: Diagnosis not present

## 2018-02-12 DIAGNOSIS — S22081A Stable burst fracture of T11-T12 vertebra, initial encounter for closed fracture: Secondary | ICD-10-CM | POA: Diagnosis not present

## 2018-02-12 DIAGNOSIS — Z043 Encounter for examination and observation following other accident: Secondary | ICD-10-CM | POA: Diagnosis not present

## 2018-02-12 DIAGNOSIS — M5416 Radiculopathy, lumbar region: Secondary | ICD-10-CM | POA: Diagnosis not present

## 2018-02-12 DIAGNOSIS — S22071A Stable burst fracture of T9-T10 vertebra, initial encounter for closed fracture: Secondary | ICD-10-CM | POA: Diagnosis not present

## 2018-02-12 DIAGNOSIS — Y999 Unspecified external cause status: Secondary | ICD-10-CM | POA: Diagnosis not present

## 2018-02-12 DIAGNOSIS — Z79899 Other long term (current) drug therapy: Secondary | ICD-10-CM | POA: Diagnosis not present

## 2018-02-12 DIAGNOSIS — D696 Thrombocytopenia, unspecified: Secondary | ICD-10-CM | POA: Diagnosis not present

## 2018-02-12 DIAGNOSIS — J690 Pneumonitis due to inhalation of food and vomit: Secondary | ICD-10-CM | POA: Diagnosis not present

## 2018-02-12 DIAGNOSIS — I4891 Unspecified atrial fibrillation: Secondary | ICD-10-CM | POA: Diagnosis not present

## 2018-02-12 DIAGNOSIS — I5020 Unspecified systolic (congestive) heart failure: Secondary | ICD-10-CM | POA: Diagnosis not present

## 2018-02-13 DIAGNOSIS — G40909 Epilepsy, unspecified, not intractable, without status epilepticus: Secondary | ICD-10-CM | POA: Diagnosis present

## 2018-02-13 DIAGNOSIS — R9431 Abnormal electrocardiogram [ECG] [EKG]: Secondary | ICD-10-CM | POA: Diagnosis not present

## 2018-02-13 DIAGNOSIS — S22082A Unstable burst fracture of T11-T12 vertebra, initial encounter for closed fracture: Secondary | ICD-10-CM | POA: Diagnosis not present

## 2018-02-13 DIAGNOSIS — G9520 Unspecified cord compression: Secondary | ICD-10-CM | POA: Diagnosis present

## 2018-02-13 DIAGNOSIS — E871 Hypo-osmolality and hyponatremia: Secondary | ICD-10-CM | POA: Diagnosis present

## 2018-02-13 DIAGNOSIS — I5022 Chronic systolic (congestive) heart failure: Secondary | ICD-10-CM | POA: Diagnosis present

## 2018-02-13 DIAGNOSIS — S22072A Unstable burst fracture of T9-T10 vertebra, initial encounter for closed fracture: Secondary | ICD-10-CM | POA: Diagnosis not present

## 2018-02-13 DIAGNOSIS — M4804 Spinal stenosis, thoracic region: Secondary | ICD-10-CM | POA: Diagnosis not present

## 2018-02-13 DIAGNOSIS — K5900 Constipation, unspecified: Secondary | ICD-10-CM | POA: Diagnosis not present

## 2018-02-13 DIAGNOSIS — K7469 Other cirrhosis of liver: Secondary | ICD-10-CM | POA: Diagnosis not present

## 2018-02-13 DIAGNOSIS — F05 Delirium due to known physiological condition: Secondary | ICD-10-CM | POA: Diagnosis not present

## 2018-02-13 DIAGNOSIS — D638 Anemia in other chronic diseases classified elsewhere: Secondary | ICD-10-CM | POA: Diagnosis present

## 2018-02-13 DIAGNOSIS — I4892 Unspecified atrial flutter: Secondary | ICD-10-CM | POA: Diagnosis not present

## 2018-02-13 DIAGNOSIS — M4625 Osteomyelitis of vertebra, thoracolumbar region: Secondary | ICD-10-CM | POA: Diagnosis not present

## 2018-02-13 DIAGNOSIS — I517 Cardiomegaly: Secondary | ICD-10-CM | POA: Diagnosis not present

## 2018-02-13 DIAGNOSIS — F329 Major depressive disorder, single episode, unspecified: Secondary | ICD-10-CM | POA: Diagnosis present

## 2018-02-13 DIAGNOSIS — E8809 Other disorders of plasma-protein metabolism, not elsewhere classified: Secondary | ICD-10-CM | POA: Diagnosis not present

## 2018-02-13 DIAGNOSIS — F419 Anxiety disorder, unspecified: Secondary | ICD-10-CM | POA: Diagnosis present

## 2018-02-13 DIAGNOSIS — M40209 Unspecified kyphosis, site unspecified: Secondary | ICD-10-CM | POA: Diagnosis present

## 2018-02-13 DIAGNOSIS — I480 Paroxysmal atrial fibrillation: Secondary | ICD-10-CM | POA: Diagnosis present

## 2018-02-13 DIAGNOSIS — W19XXXD Unspecified fall, subsequent encounter: Secondary | ICD-10-CM | POA: Diagnosis not present

## 2018-02-13 DIAGNOSIS — S22081A Stable burst fracture of T11-T12 vertebra, initial encounter for closed fracture: Secondary | ICD-10-CM | POA: Diagnosis present

## 2018-02-13 DIAGNOSIS — D649 Anemia, unspecified: Secondary | ICD-10-CM | POA: Diagnosis not present

## 2018-02-13 DIAGNOSIS — Z79899 Other long term (current) drug therapy: Secondary | ICD-10-CM | POA: Diagnosis not present

## 2018-02-13 DIAGNOSIS — R531 Weakness: Secondary | ICD-10-CM | POA: Diagnosis not present

## 2018-02-13 DIAGNOSIS — D61818 Other pancytopenia: Secondary | ICD-10-CM | POA: Diagnosis not present

## 2018-02-13 DIAGNOSIS — M4624 Osteomyelitis of vertebra, thoracic region: Secondary | ICD-10-CM | POA: Diagnosis present

## 2018-02-13 DIAGNOSIS — I493 Ventricular premature depolarization: Secondary | ICD-10-CM | POA: Diagnosis present

## 2018-02-13 DIAGNOSIS — I428 Other cardiomyopathies: Secondary | ICD-10-CM | POA: Diagnosis present

## 2018-02-13 DIAGNOSIS — W19XXXA Unspecified fall, initial encounter: Secondary | ICD-10-CM | POA: Diagnosis not present

## 2018-02-13 DIAGNOSIS — S22089A Unspecified fracture of T11-T12 vertebra, initial encounter for closed fracture: Secondary | ICD-10-CM | POA: Diagnosis not present

## 2018-02-13 DIAGNOSIS — I110 Hypertensive heart disease with heart failure: Secondary | ICD-10-CM | POA: Diagnosis present

## 2018-02-13 DIAGNOSIS — Z8659 Personal history of other mental and behavioral disorders: Secondary | ICD-10-CM | POA: Diagnosis not present

## 2018-02-13 DIAGNOSIS — B9689 Other specified bacterial agents as the cause of diseases classified elsewhere: Secondary | ICD-10-CM | POA: Diagnosis not present

## 2018-02-13 DIAGNOSIS — S22071A Stable burst fracture of T9-T10 vertebra, initial encounter for closed fracture: Secondary | ICD-10-CM | POA: Diagnosis not present

## 2018-02-13 DIAGNOSIS — Z981 Arthrodesis status: Secondary | ICD-10-CM | POA: Diagnosis not present

## 2018-02-13 DIAGNOSIS — Y999 Unspecified external cause status: Secondary | ICD-10-CM | POA: Diagnosis not present

## 2018-02-13 DIAGNOSIS — G8929 Other chronic pain: Secondary | ICD-10-CM | POA: Diagnosis present

## 2018-02-13 DIAGNOSIS — R7881 Bacteremia: Secondary | ICD-10-CM | POA: Diagnosis present

## 2018-02-13 DIAGNOSIS — B182 Chronic viral hepatitis C: Secondary | ICD-10-CM | POA: Diagnosis present

## 2018-02-13 DIAGNOSIS — E440 Moderate protein-calorie malnutrition: Secondary | ICD-10-CM | POA: Diagnosis not present

## 2018-02-13 DIAGNOSIS — W1839XA Other fall on same level, initial encounter: Secondary | ICD-10-CM | POA: Diagnosis not present

## 2018-02-13 DIAGNOSIS — J690 Pneumonitis due to inhalation of food and vomit: Secondary | ICD-10-CM | POA: Diagnosis not present

## 2018-02-13 DIAGNOSIS — K7460 Unspecified cirrhosis of liver: Secondary | ICD-10-CM | POA: Diagnosis present

## 2018-02-13 DIAGNOSIS — S229XXA Fracture of bony thorax, part unspecified, initial encounter for closed fracture: Secondary | ICD-10-CM | POA: Diagnosis not present

## 2018-02-13 DIAGNOSIS — R45850 Homicidal ideations: Secondary | ICD-10-CM | POA: Diagnosis present

## 2018-02-13 DIAGNOSIS — G4733 Obstructive sleep apnea (adult) (pediatric): Secondary | ICD-10-CM | POA: Diagnosis present

## 2018-02-13 DIAGNOSIS — I4891 Unspecified atrial fibrillation: Secondary | ICD-10-CM | POA: Diagnosis not present

## 2018-02-13 DIAGNOSIS — M40295 Other kyphosis, thoracolumbar region: Secondary | ICD-10-CM | POA: Diagnosis not present

## 2018-02-13 DIAGNOSIS — M545 Low back pain: Secondary | ICD-10-CM | POA: Diagnosis not present

## 2018-02-13 DIAGNOSIS — Z9889 Other specified postprocedural states: Secondary | ICD-10-CM | POA: Diagnosis not present

## 2018-02-23 DIAGNOSIS — S22071D Stable burst fracture of T9-T10 vertebra, subsequent encounter for fracture with routine healing: Secondary | ICD-10-CM | POA: Diagnosis not present

## 2018-02-23 DIAGNOSIS — M4620 Osteomyelitis of vertebra, site unspecified: Secondary | ICD-10-CM | POA: Diagnosis not present

## 2018-02-23 DIAGNOSIS — S22081D Stable burst fracture of T11-T12 vertebra, subsequent encounter for fracture with routine healing: Secondary | ICD-10-CM | POA: Diagnosis not present

## 2018-02-23 DIAGNOSIS — R531 Weakness: Secondary | ICD-10-CM | POA: Diagnosis not present

## 2018-02-23 DIAGNOSIS — B9689 Other specified bacterial agents as the cause of diseases classified elsewhere: Secondary | ICD-10-CM | POA: Diagnosis not present

## 2018-02-23 DIAGNOSIS — I429 Cardiomyopathy, unspecified: Secondary | ICD-10-CM | POA: Diagnosis not present

## 2018-02-23 DIAGNOSIS — I480 Paroxysmal atrial fibrillation: Secondary | ICD-10-CM | POA: Diagnosis not present

## 2018-02-23 DIAGNOSIS — D638 Anemia in other chronic diseases classified elsewhere: Secondary | ICD-10-CM | POA: Diagnosis not present

## 2018-02-23 DIAGNOSIS — D62 Acute posthemorrhagic anemia: Secondary | ICD-10-CM | POA: Diagnosis not present

## 2018-02-23 DIAGNOSIS — Z7409 Other reduced mobility: Secondary | ICD-10-CM | POA: Diagnosis not present

## 2018-02-23 DIAGNOSIS — Z981 Arthrodesis status: Secondary | ICD-10-CM | POA: Diagnosis not present

## 2018-02-23 DIAGNOSIS — D61818 Other pancytopenia: Secondary | ICD-10-CM | POA: Diagnosis not present

## 2018-02-24 DIAGNOSIS — M869 Osteomyelitis, unspecified: Secondary | ICD-10-CM | POA: Diagnosis not present

## 2018-02-24 DIAGNOSIS — W19XXXA Unspecified fall, initial encounter: Secondary | ICD-10-CM | POA: Diagnosis not present

## 2018-02-24 DIAGNOSIS — D61818 Other pancytopenia: Secondary | ICD-10-CM | POA: Diagnosis not present

## 2018-02-24 DIAGNOSIS — K766 Portal hypertension: Secondary | ICD-10-CM | POA: Diagnosis not present

## 2018-02-24 DIAGNOSIS — M6281 Muscle weakness (generalized): Secondary | ICD-10-CM | POA: Diagnosis not present

## 2018-02-24 DIAGNOSIS — B9689 Other specified bacterial agents as the cause of diseases classified elsewhere: Secondary | ICD-10-CM | POA: Diagnosis not present

## 2018-02-24 DIAGNOSIS — Z7409 Other reduced mobility: Secondary | ICD-10-CM | POA: Diagnosis not present

## 2018-02-24 DIAGNOSIS — S22071D Stable burst fracture of T9-T10 vertebra, subsequent encounter for fracture with routine healing: Secondary | ICD-10-CM | POA: Diagnosis not present

## 2018-02-24 DIAGNOSIS — Z981 Arthrodesis status: Secondary | ICD-10-CM | POA: Diagnosis not present

## 2018-02-24 DIAGNOSIS — M4620 Osteomyelitis of vertebra, site unspecified: Secondary | ICD-10-CM | POA: Diagnosis not present

## 2018-02-24 DIAGNOSIS — I420 Dilated cardiomyopathy: Secondary | ICD-10-CM | POA: Diagnosis not present

## 2018-02-24 DIAGNOSIS — D62 Acute posthemorrhagic anemia: Secondary | ICD-10-CM | POA: Diagnosis not present

## 2018-02-24 DIAGNOSIS — S22081D Stable burst fracture of T11-T12 vertebra, subsequent encounter for fracture with routine healing: Secondary | ICD-10-CM | POA: Diagnosis not present

## 2018-02-24 DIAGNOSIS — D649 Anemia, unspecified: Secondary | ICD-10-CM | POA: Diagnosis not present

## 2018-02-24 DIAGNOSIS — M961 Postlaminectomy syndrome, not elsewhere classified: Secondary | ICD-10-CM | POA: Diagnosis not present

## 2018-02-24 DIAGNOSIS — E440 Moderate protein-calorie malnutrition: Secondary | ICD-10-CM | POA: Diagnosis not present

## 2018-02-24 DIAGNOSIS — E871 Hypo-osmolality and hyponatremia: Secondary | ICD-10-CM | POA: Diagnosis not present

## 2018-02-24 DIAGNOSIS — D638 Anemia in other chronic diseases classified elsewhere: Secondary | ICD-10-CM | POA: Diagnosis not present

## 2018-02-24 DIAGNOSIS — I480 Paroxysmal atrial fibrillation: Secondary | ICD-10-CM | POA: Diagnosis not present

## 2018-02-24 DIAGNOSIS — K7460 Unspecified cirrhosis of liver: Secondary | ICD-10-CM | POA: Diagnosis not present

## 2018-02-24 DIAGNOSIS — I429 Cardiomyopathy, unspecified: Secondary | ICD-10-CM | POA: Diagnosis not present

## 2018-02-24 DIAGNOSIS — R531 Weakness: Secondary | ICD-10-CM | POA: Diagnosis not present

## 2018-02-25 DIAGNOSIS — E871 Hypo-osmolality and hyponatremia: Secondary | ICD-10-CM | POA: Diagnosis not present

## 2018-02-25 DIAGNOSIS — Z981 Arthrodesis status: Secondary | ICD-10-CM | POA: Diagnosis not present

## 2018-02-25 DIAGNOSIS — S22071D Stable burst fracture of T9-T10 vertebra, subsequent encounter for fracture with routine healing: Secondary | ICD-10-CM | POA: Diagnosis not present

## 2018-02-25 DIAGNOSIS — D61818 Other pancytopenia: Secondary | ICD-10-CM | POA: Diagnosis not present

## 2018-02-25 DIAGNOSIS — R531 Weakness: Secondary | ICD-10-CM | POA: Diagnosis not present

## 2018-02-25 DIAGNOSIS — M6281 Muscle weakness (generalized): Secondary | ICD-10-CM | POA: Diagnosis not present

## 2018-02-25 DIAGNOSIS — I429 Cardiomyopathy, unspecified: Secondary | ICD-10-CM | POA: Diagnosis not present

## 2018-02-25 DIAGNOSIS — I480 Paroxysmal atrial fibrillation: Secondary | ICD-10-CM | POA: Diagnosis not present

## 2018-02-25 DIAGNOSIS — M4620 Osteomyelitis of vertebra, site unspecified: Secondary | ICD-10-CM | POA: Diagnosis not present

## 2018-02-25 DIAGNOSIS — B9689 Other specified bacterial agents as the cause of diseases classified elsewhere: Secondary | ICD-10-CM | POA: Diagnosis not present

## 2018-02-25 DIAGNOSIS — M869 Osteomyelitis, unspecified: Secondary | ICD-10-CM | POA: Diagnosis not present

## 2018-02-25 DIAGNOSIS — K766 Portal hypertension: Secondary | ICD-10-CM | POA: Diagnosis not present

## 2018-02-25 DIAGNOSIS — D638 Anemia in other chronic diseases classified elsewhere: Secondary | ICD-10-CM | POA: Diagnosis not present

## 2018-02-25 DIAGNOSIS — Z7409 Other reduced mobility: Secondary | ICD-10-CM | POA: Diagnosis not present

## 2018-02-25 DIAGNOSIS — W19XXXA Unspecified fall, initial encounter: Secondary | ICD-10-CM | POA: Diagnosis not present

## 2018-02-25 DIAGNOSIS — D649 Anemia, unspecified: Secondary | ICD-10-CM | POA: Diagnosis not present

## 2018-02-25 DIAGNOSIS — M961 Postlaminectomy syndrome, not elsewhere classified: Secondary | ICD-10-CM | POA: Diagnosis not present

## 2018-02-25 DIAGNOSIS — S22081D Stable burst fracture of T11-T12 vertebra, subsequent encounter for fracture with routine healing: Secondary | ICD-10-CM | POA: Diagnosis not present

## 2018-02-25 DIAGNOSIS — D62 Acute posthemorrhagic anemia: Secondary | ICD-10-CM | POA: Diagnosis not present

## 2018-02-25 DIAGNOSIS — I420 Dilated cardiomyopathy: Secondary | ICD-10-CM | POA: Diagnosis not present

## 2018-02-25 DIAGNOSIS — K7460 Unspecified cirrhosis of liver: Secondary | ICD-10-CM | POA: Diagnosis not present

## 2018-02-25 DIAGNOSIS — E440 Moderate protein-calorie malnutrition: Secondary | ICD-10-CM | POA: Diagnosis not present

## 2018-02-26 DIAGNOSIS — M961 Postlaminectomy syndrome, not elsewhere classified: Secondary | ICD-10-CM | POA: Diagnosis not present

## 2018-02-26 DIAGNOSIS — M869 Osteomyelitis, unspecified: Secondary | ICD-10-CM | POA: Diagnosis not present

## 2018-02-26 DIAGNOSIS — D649 Anemia, unspecified: Secondary | ICD-10-CM | POA: Diagnosis not present

## 2018-02-26 DIAGNOSIS — D61818 Other pancytopenia: Secondary | ICD-10-CM | POA: Diagnosis not present

## 2018-02-26 DIAGNOSIS — E440 Moderate protein-calorie malnutrition: Secondary | ICD-10-CM | POA: Diagnosis not present

## 2018-02-26 DIAGNOSIS — K766 Portal hypertension: Secondary | ICD-10-CM | POA: Diagnosis not present

## 2018-02-26 DIAGNOSIS — K7460 Unspecified cirrhosis of liver: Secondary | ICD-10-CM | POA: Diagnosis not present

## 2018-02-26 DIAGNOSIS — W19XXXA Unspecified fall, initial encounter: Secondary | ICD-10-CM | POA: Diagnosis not present

## 2018-02-26 DIAGNOSIS — I480 Paroxysmal atrial fibrillation: Secondary | ICD-10-CM | POA: Diagnosis not present

## 2018-02-26 DIAGNOSIS — M6281 Muscle weakness (generalized): Secondary | ICD-10-CM | POA: Diagnosis not present

## 2018-02-26 DIAGNOSIS — E871 Hypo-osmolality and hyponatremia: Secondary | ICD-10-CM | POA: Diagnosis not present

## 2018-02-26 DIAGNOSIS — I420 Dilated cardiomyopathy: Secondary | ICD-10-CM | POA: Diagnosis not present

## 2018-02-27 DIAGNOSIS — M869 Osteomyelitis, unspecified: Secondary | ICD-10-CM | POA: Diagnosis not present

## 2018-02-27 DIAGNOSIS — D61818 Other pancytopenia: Secondary | ICD-10-CM | POA: Diagnosis not present

## 2018-02-27 DIAGNOSIS — M6281 Muscle weakness (generalized): Secondary | ICD-10-CM | POA: Diagnosis not present

## 2018-02-27 DIAGNOSIS — W19XXXA Unspecified fall, initial encounter: Secondary | ICD-10-CM | POA: Diagnosis not present

## 2018-02-27 DIAGNOSIS — E871 Hypo-osmolality and hyponatremia: Secondary | ICD-10-CM | POA: Diagnosis not present

## 2018-02-27 DIAGNOSIS — K7460 Unspecified cirrhosis of liver: Secondary | ICD-10-CM | POA: Diagnosis not present

## 2018-02-27 DIAGNOSIS — I480 Paroxysmal atrial fibrillation: Secondary | ICD-10-CM | POA: Diagnosis not present

## 2018-02-27 DIAGNOSIS — D649 Anemia, unspecified: Secondary | ICD-10-CM | POA: Diagnosis not present

## 2018-02-27 DIAGNOSIS — K766 Portal hypertension: Secondary | ICD-10-CM | POA: Diagnosis not present

## 2018-02-27 DIAGNOSIS — M961 Postlaminectomy syndrome, not elsewhere classified: Secondary | ICD-10-CM | POA: Diagnosis not present

## 2018-02-27 DIAGNOSIS — I420 Dilated cardiomyopathy: Secondary | ICD-10-CM | POA: Diagnosis not present

## 2018-02-27 DIAGNOSIS — E440 Moderate protein-calorie malnutrition: Secondary | ICD-10-CM | POA: Diagnosis not present

## 2018-02-28 DIAGNOSIS — B9689 Other specified bacterial agents as the cause of diseases classified elsewhere: Secondary | ICD-10-CM | POA: Diagnosis not present

## 2018-02-28 DIAGNOSIS — Z981 Arthrodesis status: Secondary | ICD-10-CM | POA: Diagnosis not present

## 2018-02-28 DIAGNOSIS — M4620 Osteomyelitis of vertebra, site unspecified: Secondary | ICD-10-CM | POA: Diagnosis not present

## 2018-02-28 DIAGNOSIS — S22081D Stable burst fracture of T11-T12 vertebra, subsequent encounter for fracture with routine healing: Secondary | ICD-10-CM | POA: Diagnosis not present

## 2018-02-28 DIAGNOSIS — M6281 Muscle weakness (generalized): Secondary | ICD-10-CM | POA: Diagnosis not present

## 2018-02-28 DIAGNOSIS — R531 Weakness: Secondary | ICD-10-CM | POA: Diagnosis not present

## 2018-02-28 DIAGNOSIS — M961 Postlaminectomy syndrome, not elsewhere classified: Secondary | ICD-10-CM | POA: Diagnosis not present

## 2018-02-28 DIAGNOSIS — Z7409 Other reduced mobility: Secondary | ICD-10-CM | POA: Diagnosis not present

## 2018-02-28 DIAGNOSIS — K766 Portal hypertension: Secondary | ICD-10-CM | POA: Diagnosis not present

## 2018-02-28 DIAGNOSIS — I429 Cardiomyopathy, unspecified: Secondary | ICD-10-CM | POA: Diagnosis not present

## 2018-02-28 DIAGNOSIS — E440 Moderate protein-calorie malnutrition: Secondary | ICD-10-CM | POA: Diagnosis not present

## 2018-02-28 DIAGNOSIS — I480 Paroxysmal atrial fibrillation: Secondary | ICD-10-CM | POA: Diagnosis not present

## 2018-02-28 DIAGNOSIS — W19XXXA Unspecified fall, initial encounter: Secondary | ICD-10-CM | POA: Diagnosis not present

## 2018-02-28 DIAGNOSIS — E871 Hypo-osmolality and hyponatremia: Secondary | ICD-10-CM | POA: Diagnosis not present

## 2018-02-28 DIAGNOSIS — I420 Dilated cardiomyopathy: Secondary | ICD-10-CM | POA: Diagnosis not present

## 2018-02-28 DIAGNOSIS — D649 Anemia, unspecified: Secondary | ICD-10-CM | POA: Diagnosis not present

## 2018-02-28 DIAGNOSIS — D638 Anemia in other chronic diseases classified elsewhere: Secondary | ICD-10-CM | POA: Diagnosis not present

## 2018-02-28 DIAGNOSIS — D62 Acute posthemorrhagic anemia: Secondary | ICD-10-CM | POA: Diagnosis not present

## 2018-02-28 DIAGNOSIS — M869 Osteomyelitis, unspecified: Secondary | ICD-10-CM | POA: Diagnosis not present

## 2018-02-28 DIAGNOSIS — K7460 Unspecified cirrhosis of liver: Secondary | ICD-10-CM | POA: Diagnosis not present

## 2018-02-28 DIAGNOSIS — S22071D Stable burst fracture of T9-T10 vertebra, subsequent encounter for fracture with routine healing: Secondary | ICD-10-CM | POA: Diagnosis not present

## 2018-02-28 DIAGNOSIS — D61818 Other pancytopenia: Secondary | ICD-10-CM | POA: Diagnosis not present

## 2018-03-01 DIAGNOSIS — I480 Paroxysmal atrial fibrillation: Secondary | ICD-10-CM | POA: Diagnosis not present

## 2018-03-01 DIAGNOSIS — Z7409 Other reduced mobility: Secondary | ICD-10-CM | POA: Diagnosis not present

## 2018-03-01 DIAGNOSIS — D638 Anemia in other chronic diseases classified elsewhere: Secondary | ICD-10-CM | POA: Diagnosis not present

## 2018-03-01 DIAGNOSIS — K7460 Unspecified cirrhosis of liver: Secondary | ICD-10-CM | POA: Diagnosis not present

## 2018-03-01 DIAGNOSIS — R531 Weakness: Secondary | ICD-10-CM | POA: Diagnosis not present

## 2018-03-01 DIAGNOSIS — E440 Moderate protein-calorie malnutrition: Secondary | ICD-10-CM | POA: Diagnosis not present

## 2018-03-01 DIAGNOSIS — I429 Cardiomyopathy, unspecified: Secondary | ICD-10-CM | POA: Diagnosis not present

## 2018-03-01 DIAGNOSIS — W19XXXA Unspecified fall, initial encounter: Secondary | ICD-10-CM | POA: Diagnosis not present

## 2018-03-01 DIAGNOSIS — S22081D Stable burst fracture of T11-T12 vertebra, subsequent encounter for fracture with routine healing: Secondary | ICD-10-CM | POA: Diagnosis not present

## 2018-03-01 DIAGNOSIS — E871 Hypo-osmolality and hyponatremia: Secondary | ICD-10-CM | POA: Diagnosis not present

## 2018-03-01 DIAGNOSIS — B9689 Other specified bacterial agents as the cause of diseases classified elsewhere: Secondary | ICD-10-CM | POA: Diagnosis not present

## 2018-03-01 DIAGNOSIS — M4620 Osteomyelitis of vertebra, site unspecified: Secondary | ICD-10-CM | POA: Diagnosis not present

## 2018-03-01 DIAGNOSIS — D61818 Other pancytopenia: Secondary | ICD-10-CM | POA: Diagnosis not present

## 2018-03-01 DIAGNOSIS — M869 Osteomyelitis, unspecified: Secondary | ICD-10-CM | POA: Diagnosis not present

## 2018-03-01 DIAGNOSIS — K766 Portal hypertension: Secondary | ICD-10-CM | POA: Diagnosis not present

## 2018-03-01 DIAGNOSIS — Z981 Arthrodesis status: Secondary | ICD-10-CM | POA: Diagnosis not present

## 2018-03-01 DIAGNOSIS — M961 Postlaminectomy syndrome, not elsewhere classified: Secondary | ICD-10-CM | POA: Diagnosis not present

## 2018-03-01 DIAGNOSIS — D649 Anemia, unspecified: Secondary | ICD-10-CM | POA: Diagnosis not present

## 2018-03-01 DIAGNOSIS — I420 Dilated cardiomyopathy: Secondary | ICD-10-CM | POA: Diagnosis not present

## 2018-03-01 DIAGNOSIS — M6281 Muscle weakness (generalized): Secondary | ICD-10-CM | POA: Diagnosis not present

## 2018-03-01 DIAGNOSIS — D62 Acute posthemorrhagic anemia: Secondary | ICD-10-CM | POA: Diagnosis not present

## 2018-03-01 DIAGNOSIS — S22071D Stable burst fracture of T9-T10 vertebra, subsequent encounter for fracture with routine healing: Secondary | ICD-10-CM | POA: Diagnosis not present

## 2018-03-02 DIAGNOSIS — K766 Portal hypertension: Secondary | ICD-10-CM | POA: Diagnosis not present

## 2018-03-02 DIAGNOSIS — D649 Anemia, unspecified: Secondary | ICD-10-CM | POA: Diagnosis not present

## 2018-03-02 DIAGNOSIS — Z981 Arthrodesis status: Secondary | ICD-10-CM | POA: Diagnosis not present

## 2018-03-02 DIAGNOSIS — E871 Hypo-osmolality and hyponatremia: Secondary | ICD-10-CM | POA: Diagnosis not present

## 2018-03-02 DIAGNOSIS — B9689 Other specified bacterial agents as the cause of diseases classified elsewhere: Secondary | ICD-10-CM | POA: Diagnosis not present

## 2018-03-02 DIAGNOSIS — M961 Postlaminectomy syndrome, not elsewhere classified: Secondary | ICD-10-CM | POA: Diagnosis not present

## 2018-03-02 DIAGNOSIS — E440 Moderate protein-calorie malnutrition: Secondary | ICD-10-CM | POA: Diagnosis not present

## 2018-03-02 DIAGNOSIS — D61818 Other pancytopenia: Secondary | ICD-10-CM | POA: Diagnosis not present

## 2018-03-02 DIAGNOSIS — I480 Paroxysmal atrial fibrillation: Secondary | ICD-10-CM | POA: Diagnosis not present

## 2018-03-02 DIAGNOSIS — W19XXXA Unspecified fall, initial encounter: Secondary | ICD-10-CM | POA: Diagnosis not present

## 2018-03-02 DIAGNOSIS — Z7409 Other reduced mobility: Secondary | ICD-10-CM | POA: Diagnosis not present

## 2018-03-02 DIAGNOSIS — M869 Osteomyelitis, unspecified: Secondary | ICD-10-CM | POA: Diagnosis not present

## 2018-03-02 DIAGNOSIS — M6281 Muscle weakness (generalized): Secondary | ICD-10-CM | POA: Diagnosis not present

## 2018-03-02 DIAGNOSIS — S22071D Stable burst fracture of T9-T10 vertebra, subsequent encounter for fracture with routine healing: Secondary | ICD-10-CM | POA: Diagnosis not present

## 2018-03-02 DIAGNOSIS — I429 Cardiomyopathy, unspecified: Secondary | ICD-10-CM | POA: Diagnosis not present

## 2018-03-02 DIAGNOSIS — I420 Dilated cardiomyopathy: Secondary | ICD-10-CM | POA: Diagnosis not present

## 2018-03-02 DIAGNOSIS — K7460 Unspecified cirrhosis of liver: Secondary | ICD-10-CM | POA: Diagnosis not present

## 2018-03-02 DIAGNOSIS — I959 Hypotension, unspecified: Secondary | ICD-10-CM | POA: Diagnosis not present

## 2018-03-02 DIAGNOSIS — R188 Other ascites: Secondary | ICD-10-CM | POA: Diagnosis not present

## 2018-03-02 DIAGNOSIS — B1920 Unspecified viral hepatitis C without hepatic coma: Secondary | ICD-10-CM | POA: Diagnosis not present

## 2018-03-03 DIAGNOSIS — D649 Anemia, unspecified: Secondary | ICD-10-CM | POA: Diagnosis not present

## 2018-03-03 DIAGNOSIS — K766 Portal hypertension: Secondary | ICD-10-CM | POA: Diagnosis not present

## 2018-03-03 DIAGNOSIS — K7460 Unspecified cirrhosis of liver: Secondary | ICD-10-CM | POA: Diagnosis not present

## 2018-03-03 DIAGNOSIS — E440 Moderate protein-calorie malnutrition: Secondary | ICD-10-CM | POA: Diagnosis not present

## 2018-03-03 DIAGNOSIS — B9689 Other specified bacterial agents as the cause of diseases classified elsewhere: Secondary | ICD-10-CM | POA: Diagnosis not present

## 2018-03-03 DIAGNOSIS — I429 Cardiomyopathy, unspecified: Secondary | ICD-10-CM | POA: Diagnosis not present

## 2018-03-03 DIAGNOSIS — S22071D Stable burst fracture of T9-T10 vertebra, subsequent encounter for fracture with routine healing: Secondary | ICD-10-CM | POA: Diagnosis not present

## 2018-03-03 DIAGNOSIS — E871 Hypo-osmolality and hyponatremia: Secondary | ICD-10-CM | POA: Diagnosis not present

## 2018-03-03 DIAGNOSIS — I480 Paroxysmal atrial fibrillation: Secondary | ICD-10-CM | POA: Diagnosis not present

## 2018-03-03 DIAGNOSIS — Z981 Arthrodesis status: Secondary | ICD-10-CM | POA: Diagnosis not present

## 2018-03-03 DIAGNOSIS — M869 Osteomyelitis, unspecified: Secondary | ICD-10-CM | POA: Diagnosis not present

## 2018-03-03 DIAGNOSIS — R188 Other ascites: Secondary | ICD-10-CM | POA: Diagnosis not present

## 2018-03-03 DIAGNOSIS — I420 Dilated cardiomyopathy: Secondary | ICD-10-CM | POA: Diagnosis not present

## 2018-03-03 DIAGNOSIS — W19XXXA Unspecified fall, initial encounter: Secondary | ICD-10-CM | POA: Diagnosis not present

## 2018-03-03 DIAGNOSIS — D61818 Other pancytopenia: Secondary | ICD-10-CM | POA: Diagnosis not present

## 2018-03-03 DIAGNOSIS — Z7409 Other reduced mobility: Secondary | ICD-10-CM | POA: Diagnosis not present

## 2018-03-03 DIAGNOSIS — M961 Postlaminectomy syndrome, not elsewhere classified: Secondary | ICD-10-CM | POA: Diagnosis not present

## 2018-03-03 DIAGNOSIS — M6281 Muscle weakness (generalized): Secondary | ICD-10-CM | POA: Diagnosis not present

## 2018-03-03 DIAGNOSIS — B1920 Unspecified viral hepatitis C without hepatic coma: Secondary | ICD-10-CM | POA: Diagnosis not present

## 2018-03-03 DIAGNOSIS — I959 Hypotension, unspecified: Secondary | ICD-10-CM | POA: Diagnosis not present

## 2018-03-04 DIAGNOSIS — K7460 Unspecified cirrhosis of liver: Secondary | ICD-10-CM | POA: Diagnosis not present

## 2018-03-04 DIAGNOSIS — M4620 Osteomyelitis of vertebra, site unspecified: Secondary | ICD-10-CM | POA: Diagnosis not present

## 2018-03-04 DIAGNOSIS — S22071D Stable burst fracture of T9-T10 vertebra, subsequent encounter for fracture with routine healing: Secondary | ICD-10-CM | POA: Diagnosis not present

## 2018-03-04 DIAGNOSIS — I429 Cardiomyopathy, unspecified: Secondary | ICD-10-CM | POA: Diagnosis not present

## 2018-03-04 DIAGNOSIS — I480 Paroxysmal atrial fibrillation: Secondary | ICD-10-CM | POA: Diagnosis not present

## 2018-03-04 DIAGNOSIS — D61818 Other pancytopenia: Secondary | ICD-10-CM | POA: Diagnosis not present

## 2018-03-04 DIAGNOSIS — Z981 Arthrodesis status: Secondary | ICD-10-CM | POA: Diagnosis not present

## 2018-03-04 DIAGNOSIS — R188 Other ascites: Secondary | ICD-10-CM | POA: Diagnosis not present

## 2018-03-04 DIAGNOSIS — B1920 Unspecified viral hepatitis C without hepatic coma: Secondary | ICD-10-CM | POA: Diagnosis not present

## 2018-03-04 DIAGNOSIS — B9689 Other specified bacterial agents as the cause of diseases classified elsewhere: Secondary | ICD-10-CM | POA: Diagnosis not present

## 2018-03-04 DIAGNOSIS — Z7409 Other reduced mobility: Secondary | ICD-10-CM | POA: Diagnosis not present

## 2018-03-04 DIAGNOSIS — S22081D Stable burst fracture of T11-T12 vertebra, subsequent encounter for fracture with routine healing: Secondary | ICD-10-CM | POA: Diagnosis not present

## 2018-03-05 DIAGNOSIS — I420 Dilated cardiomyopathy: Secondary | ICD-10-CM | POA: Diagnosis not present

## 2018-03-05 DIAGNOSIS — M961 Postlaminectomy syndrome, not elsewhere classified: Secondary | ICD-10-CM | POA: Diagnosis not present

## 2018-03-05 DIAGNOSIS — I480 Paroxysmal atrial fibrillation: Secondary | ICD-10-CM | POA: Diagnosis not present

## 2018-03-05 DIAGNOSIS — R5381 Other malaise: Secondary | ICD-10-CM | POA: Diagnosis not present

## 2018-03-05 DIAGNOSIS — M6281 Muscle weakness (generalized): Secondary | ICD-10-CM | POA: Diagnosis not present

## 2018-03-05 DIAGNOSIS — J690 Pneumonitis due to inhalation of food and vomit: Secondary | ICD-10-CM | POA: Diagnosis not present

## 2018-03-05 DIAGNOSIS — D61818 Other pancytopenia: Secondary | ICD-10-CM | POA: Diagnosis not present

## 2018-03-05 DIAGNOSIS — I4891 Unspecified atrial fibrillation: Secondary | ICD-10-CM | POA: Diagnosis not present

## 2018-03-05 DIAGNOSIS — D649 Anemia, unspecified: Secondary | ICD-10-CM | POA: Diagnosis not present

## 2018-03-05 DIAGNOSIS — M869 Osteomyelitis, unspecified: Secondary | ICD-10-CM | POA: Diagnosis not present

## 2018-03-05 DIAGNOSIS — E871 Hypo-osmolality and hyponatremia: Secondary | ICD-10-CM | POA: Diagnosis not present

## 2018-03-05 DIAGNOSIS — K7460 Unspecified cirrhosis of liver: Secondary | ICD-10-CM | POA: Diagnosis not present

## 2018-03-07 DIAGNOSIS — S22071D Stable burst fracture of T9-T10 vertebra, subsequent encounter for fracture with routine healing: Secondary | ICD-10-CM | POA: Diagnosis not present

## 2018-03-07 DIAGNOSIS — K7460 Unspecified cirrhosis of liver: Secondary | ICD-10-CM | POA: Diagnosis not present

## 2018-03-07 DIAGNOSIS — D61818 Other pancytopenia: Secondary | ICD-10-CM | POA: Diagnosis not present

## 2018-03-07 DIAGNOSIS — M6281 Muscle weakness (generalized): Secondary | ICD-10-CM | POA: Diagnosis not present

## 2018-03-07 DIAGNOSIS — J690 Pneumonitis due to inhalation of food and vomit: Secondary | ICD-10-CM | POA: Diagnosis not present

## 2018-03-07 DIAGNOSIS — E871 Hypo-osmolality and hyponatremia: Secondary | ICD-10-CM | POA: Diagnosis not present

## 2018-03-07 DIAGNOSIS — Z981 Arthrodesis status: Secondary | ICD-10-CM | POA: Diagnosis not present

## 2018-03-07 DIAGNOSIS — I420 Dilated cardiomyopathy: Secondary | ICD-10-CM | POA: Diagnosis not present

## 2018-03-07 DIAGNOSIS — S22081D Stable burst fracture of T11-T12 vertebra, subsequent encounter for fracture with routine healing: Secondary | ICD-10-CM | POA: Diagnosis not present

## 2018-03-07 DIAGNOSIS — B1920 Unspecified viral hepatitis C without hepatic coma: Secondary | ICD-10-CM | POA: Diagnosis not present

## 2018-03-07 DIAGNOSIS — Z7409 Other reduced mobility: Secondary | ICD-10-CM | POA: Diagnosis not present

## 2018-03-07 DIAGNOSIS — D649 Anemia, unspecified: Secondary | ICD-10-CM | POA: Diagnosis not present

## 2018-03-07 DIAGNOSIS — R5381 Other malaise: Secondary | ICD-10-CM | POA: Diagnosis not present

## 2018-03-07 DIAGNOSIS — I480 Paroxysmal atrial fibrillation: Secondary | ICD-10-CM | POA: Diagnosis not present

## 2018-03-07 DIAGNOSIS — B9689 Other specified bacterial agents as the cause of diseases classified elsewhere: Secondary | ICD-10-CM | POA: Diagnosis not present

## 2018-03-07 DIAGNOSIS — M961 Postlaminectomy syndrome, not elsewhere classified: Secondary | ICD-10-CM | POA: Diagnosis not present

## 2018-03-07 DIAGNOSIS — M869 Osteomyelitis, unspecified: Secondary | ICD-10-CM | POA: Diagnosis not present

## 2018-03-07 DIAGNOSIS — I429 Cardiomyopathy, unspecified: Secondary | ICD-10-CM | POA: Diagnosis not present

## 2018-03-07 DIAGNOSIS — R188 Other ascites: Secondary | ICD-10-CM | POA: Diagnosis not present

## 2018-03-07 DIAGNOSIS — I4891 Unspecified atrial fibrillation: Secondary | ICD-10-CM | POA: Diagnosis not present

## 2018-03-07 DIAGNOSIS — M4620 Osteomyelitis of vertebra, site unspecified: Secondary | ICD-10-CM | POA: Diagnosis not present

## 2018-03-08 DIAGNOSIS — M4624 Osteomyelitis of vertebra, thoracic region: Secondary | ICD-10-CM | POA: Diagnosis not present

## 2018-03-08 DIAGNOSIS — B1920 Unspecified viral hepatitis C without hepatic coma: Secondary | ICD-10-CM | POA: Diagnosis not present

## 2018-03-08 DIAGNOSIS — S22071D Stable burst fracture of T9-T10 vertebra, subsequent encounter for fracture with routine healing: Secondary | ICD-10-CM | POA: Diagnosis not present

## 2018-03-08 DIAGNOSIS — B9689 Other specified bacterial agents as the cause of diseases classified elsewhere: Secondary | ICD-10-CM | POA: Diagnosis not present

## 2018-03-08 DIAGNOSIS — K766 Portal hypertension: Secondary | ICD-10-CM | POA: Diagnosis not present

## 2018-03-08 DIAGNOSIS — R188 Other ascites: Secondary | ICD-10-CM | POA: Diagnosis not present

## 2018-03-08 DIAGNOSIS — D61818 Other pancytopenia: Secondary | ICD-10-CM | POA: Diagnosis not present

## 2018-03-08 DIAGNOSIS — I959 Hypotension, unspecified: Secondary | ICD-10-CM | POA: Diagnosis not present

## 2018-03-08 DIAGNOSIS — K7460 Unspecified cirrhosis of liver: Secondary | ICD-10-CM | POA: Diagnosis not present

## 2018-03-08 DIAGNOSIS — I5022 Chronic systolic (congestive) heart failure: Secondary | ICD-10-CM | POA: Diagnosis not present

## 2018-03-08 DIAGNOSIS — I480 Paroxysmal atrial fibrillation: Secondary | ICD-10-CM | POA: Diagnosis not present

## 2018-03-08 DIAGNOSIS — M869 Osteomyelitis, unspecified: Secondary | ICD-10-CM | POA: Diagnosis not present

## 2018-03-08 DIAGNOSIS — I4891 Unspecified atrial fibrillation: Secondary | ICD-10-CM | POA: Diagnosis not present

## 2018-03-08 DIAGNOSIS — R7881 Bacteremia: Secondary | ICD-10-CM | POA: Diagnosis not present

## 2018-03-08 DIAGNOSIS — Z7409 Other reduced mobility: Secondary | ICD-10-CM | POA: Diagnosis not present

## 2018-03-08 DIAGNOSIS — E440 Moderate protein-calorie malnutrition: Secondary | ICD-10-CM | POA: Diagnosis not present

## 2018-03-08 DIAGNOSIS — I429 Cardiomyopathy, unspecified: Secondary | ICD-10-CM | POA: Diagnosis not present

## 2018-03-08 DIAGNOSIS — W19XXXA Unspecified fall, initial encounter: Secondary | ICD-10-CM | POA: Diagnosis not present

## 2018-03-08 DIAGNOSIS — S22071A Stable burst fracture of T9-T10 vertebra, initial encounter for closed fracture: Secondary | ICD-10-CM | POA: Diagnosis not present

## 2018-03-08 DIAGNOSIS — Z681 Body mass index (BMI) 19 or less, adult: Secondary | ICD-10-CM | POA: Diagnosis not present

## 2018-03-08 DIAGNOSIS — K7030 Alcoholic cirrhosis of liver without ascites: Secondary | ICD-10-CM | POA: Diagnosis not present

## 2018-03-08 DIAGNOSIS — S22081A Stable burst fracture of T11-T12 vertebra, initial encounter for closed fracture: Secondary | ICD-10-CM | POA: Diagnosis not present

## 2018-03-08 DIAGNOSIS — Z981 Arthrodesis status: Secondary | ICD-10-CM | POA: Diagnosis not present

## 2018-03-09 DIAGNOSIS — B9689 Other specified bacterial agents as the cause of diseases classified elsewhere: Secondary | ICD-10-CM | POA: Diagnosis not present

## 2018-03-09 DIAGNOSIS — S22088A Other fracture of T11-T12 vertebra, initial encounter for closed fracture: Secondary | ICD-10-CM | POA: Diagnosis not present

## 2018-03-09 DIAGNOSIS — S22078A Other fracture of T9-T10 vertebra, initial encounter for closed fracture: Secondary | ICD-10-CM | POA: Diagnosis not present

## 2018-03-09 DIAGNOSIS — Z981 Arthrodesis status: Secondary | ICD-10-CM | POA: Diagnosis not present

## 2018-03-09 DIAGNOSIS — I429 Cardiomyopathy, unspecified: Secondary | ICD-10-CM | POA: Diagnosis not present

## 2018-03-09 DIAGNOSIS — S22081A Stable burst fracture of T11-T12 vertebra, initial encounter for closed fracture: Secondary | ICD-10-CM | POA: Diagnosis not present

## 2018-03-09 DIAGNOSIS — W19XXXA Unspecified fall, initial encounter: Secondary | ICD-10-CM | POA: Diagnosis not present

## 2018-03-09 DIAGNOSIS — M4620 Osteomyelitis of vertebra, site unspecified: Secondary | ICD-10-CM | POA: Diagnosis not present

## 2018-03-09 DIAGNOSIS — R7881 Bacteremia: Secondary | ICD-10-CM | POA: Diagnosis not present

## 2018-03-09 DIAGNOSIS — M4624 Osteomyelitis of vertebra, thoracic region: Secondary | ICD-10-CM | POA: Diagnosis not present

## 2018-03-09 DIAGNOSIS — Z7409 Other reduced mobility: Secondary | ICD-10-CM | POA: Diagnosis not present

## 2018-03-09 DIAGNOSIS — I4891 Unspecified atrial fibrillation: Secondary | ICD-10-CM | POA: Diagnosis not present

## 2018-03-09 DIAGNOSIS — E440 Moderate protein-calorie malnutrition: Secondary | ICD-10-CM | POA: Diagnosis not present

## 2018-03-09 DIAGNOSIS — K766 Portal hypertension: Secondary | ICD-10-CM | POA: Diagnosis not present

## 2018-03-09 DIAGNOSIS — S22071D Stable burst fracture of T9-T10 vertebra, subsequent encounter for fracture with routine healing: Secondary | ICD-10-CM | POA: Diagnosis not present

## 2018-03-09 DIAGNOSIS — B1920 Unspecified viral hepatitis C without hepatic coma: Secondary | ICD-10-CM | POA: Diagnosis not present

## 2018-03-09 DIAGNOSIS — K7030 Alcoholic cirrhosis of liver without ascites: Secondary | ICD-10-CM | POA: Diagnosis not present

## 2018-03-09 DIAGNOSIS — S22071A Stable burst fracture of T9-T10 vertebra, initial encounter for closed fracture: Secondary | ICD-10-CM | POA: Diagnosis not present

## 2018-03-09 DIAGNOSIS — I5022 Chronic systolic (congestive) heart failure: Secondary | ICD-10-CM | POA: Diagnosis not present

## 2018-03-09 DIAGNOSIS — Z681 Body mass index (BMI) 19 or less, adult: Secondary | ICD-10-CM | POA: Diagnosis not present

## 2018-03-09 DIAGNOSIS — I480 Paroxysmal atrial fibrillation: Secondary | ICD-10-CM | POA: Diagnosis not present

## 2018-03-09 DIAGNOSIS — R188 Other ascites: Secondary | ICD-10-CM | POA: Diagnosis not present

## 2018-03-09 DIAGNOSIS — S22081D Stable burst fracture of T11-T12 vertebra, subsequent encounter for fracture with routine healing: Secondary | ICD-10-CM | POA: Diagnosis not present

## 2018-03-09 DIAGNOSIS — K7460 Unspecified cirrhosis of liver: Secondary | ICD-10-CM | POA: Diagnosis not present

## 2018-03-09 DIAGNOSIS — D61818 Other pancytopenia: Secondary | ICD-10-CM | POA: Diagnosis not present

## 2018-03-09 DIAGNOSIS — Z967 Presence of other bone and tendon implants: Secondary | ICD-10-CM | POA: Diagnosis not present

## 2018-03-10 DIAGNOSIS — A498 Other bacterial infections of unspecified site: Secondary | ICD-10-CM | POA: Diagnosis not present

## 2018-03-10 DIAGNOSIS — M4624 Osteomyelitis of vertebra, thoracic region: Secondary | ICD-10-CM | POA: Diagnosis not present

## 2018-03-10 DIAGNOSIS — I4891 Unspecified atrial fibrillation: Secondary | ICD-10-CM | POA: Diagnosis not present

## 2018-03-10 DIAGNOSIS — E46 Unspecified protein-calorie malnutrition: Secondary | ICD-10-CM | POA: Diagnosis not present

## 2018-03-10 DIAGNOSIS — S22081A Stable burst fracture of T11-T12 vertebra, initial encounter for closed fracture: Secondary | ICD-10-CM | POA: Diagnosis not present

## 2018-03-10 DIAGNOSIS — Z981 Arthrodesis status: Secondary | ICD-10-CM | POA: Diagnosis not present

## 2018-03-10 DIAGNOSIS — I429 Cardiomyopathy, unspecified: Secondary | ICD-10-CM | POA: Diagnosis not present

## 2018-03-10 DIAGNOSIS — I5022 Chronic systolic (congestive) heart failure: Secondary | ICD-10-CM | POA: Diagnosis not present

## 2018-03-10 DIAGNOSIS — K7030 Alcoholic cirrhosis of liver without ascites: Secondary | ICD-10-CM | POA: Diagnosis not present

## 2018-03-10 DIAGNOSIS — Z681 Body mass index (BMI) 19 or less, adult: Secondary | ICD-10-CM | POA: Diagnosis not present

## 2018-03-10 DIAGNOSIS — B1920 Unspecified viral hepatitis C without hepatic coma: Secondary | ICD-10-CM | POA: Diagnosis not present

## 2018-03-10 DIAGNOSIS — S22071D Stable burst fracture of T9-T10 vertebra, subsequent encounter for fracture with routine healing: Secondary | ICD-10-CM | POA: Diagnosis not present

## 2018-03-10 DIAGNOSIS — S22071A Stable burst fracture of T9-T10 vertebra, initial encounter for closed fracture: Secondary | ICD-10-CM | POA: Diagnosis not present

## 2018-03-10 DIAGNOSIS — D61818 Other pancytopenia: Secondary | ICD-10-CM | POA: Diagnosis not present

## 2018-03-10 DIAGNOSIS — Z7409 Other reduced mobility: Secondary | ICD-10-CM | POA: Diagnosis not present

## 2018-03-10 DIAGNOSIS — E440 Moderate protein-calorie malnutrition: Secondary | ICD-10-CM | POA: Diagnosis not present

## 2018-03-10 DIAGNOSIS — R188 Other ascites: Secondary | ICD-10-CM | POA: Diagnosis not present

## 2018-03-10 DIAGNOSIS — W19XXXA Unspecified fall, initial encounter: Secondary | ICD-10-CM | POA: Diagnosis not present

## 2018-03-10 DIAGNOSIS — R7881 Bacteremia: Secondary | ICD-10-CM | POA: Diagnosis not present

## 2018-03-10 DIAGNOSIS — K7460 Unspecified cirrhosis of liver: Secondary | ICD-10-CM | POA: Diagnosis not present

## 2018-03-10 DIAGNOSIS — M869 Osteomyelitis, unspecified: Secondary | ICD-10-CM | POA: Diagnosis not present

## 2018-03-10 DIAGNOSIS — I480 Paroxysmal atrial fibrillation: Secondary | ICD-10-CM | POA: Diagnosis not present

## 2018-03-10 DIAGNOSIS — K766 Portal hypertension: Secondary | ICD-10-CM | POA: Diagnosis not present

## 2018-03-11 DIAGNOSIS — R188 Other ascites: Secondary | ICD-10-CM | POA: Diagnosis not present

## 2018-03-11 DIAGNOSIS — I4891 Unspecified atrial fibrillation: Secondary | ICD-10-CM | POA: Diagnosis not present

## 2018-03-11 DIAGNOSIS — K766 Portal hypertension: Secondary | ICD-10-CM | POA: Diagnosis not present

## 2018-03-11 DIAGNOSIS — Z681 Body mass index (BMI) 19 or less, adult: Secondary | ICD-10-CM | POA: Diagnosis not present

## 2018-03-11 DIAGNOSIS — D61818 Other pancytopenia: Secondary | ICD-10-CM | POA: Diagnosis not present

## 2018-03-11 DIAGNOSIS — M869 Osteomyelitis, unspecified: Secondary | ICD-10-CM | POA: Diagnosis not present

## 2018-03-11 DIAGNOSIS — K7030 Alcoholic cirrhosis of liver without ascites: Secondary | ICD-10-CM | POA: Diagnosis not present

## 2018-03-11 DIAGNOSIS — I5022 Chronic systolic (congestive) heart failure: Secondary | ICD-10-CM | POA: Diagnosis not present

## 2018-03-11 DIAGNOSIS — M4624 Osteomyelitis of vertebra, thoracic region: Secondary | ICD-10-CM | POA: Diagnosis not present

## 2018-03-11 DIAGNOSIS — R7881 Bacteremia: Secondary | ICD-10-CM | POA: Diagnosis not present

## 2018-03-11 DIAGNOSIS — S22081A Stable burst fracture of T11-T12 vertebra, initial encounter for closed fracture: Secondary | ICD-10-CM | POA: Diagnosis not present

## 2018-03-11 DIAGNOSIS — Z981 Arthrodesis status: Secondary | ICD-10-CM | POA: Diagnosis not present

## 2018-03-11 DIAGNOSIS — E46 Unspecified protein-calorie malnutrition: Secondary | ICD-10-CM | POA: Diagnosis not present

## 2018-03-11 DIAGNOSIS — W19XXXA Unspecified fall, initial encounter: Secondary | ICD-10-CM | POA: Diagnosis not present

## 2018-03-11 DIAGNOSIS — E440 Moderate protein-calorie malnutrition: Secondary | ICD-10-CM | POA: Diagnosis not present

## 2018-03-11 DIAGNOSIS — S22071A Stable burst fracture of T9-T10 vertebra, initial encounter for closed fracture: Secondary | ICD-10-CM | POA: Diagnosis not present

## 2018-03-11 DIAGNOSIS — Z7409 Other reduced mobility: Secondary | ICD-10-CM | POA: Diagnosis not present

## 2018-03-11 DIAGNOSIS — I480 Paroxysmal atrial fibrillation: Secondary | ICD-10-CM | POA: Diagnosis not present

## 2018-03-11 DIAGNOSIS — A498 Other bacterial infections of unspecified site: Secondary | ICD-10-CM | POA: Diagnosis not present

## 2018-03-11 DIAGNOSIS — K7460 Unspecified cirrhosis of liver: Secondary | ICD-10-CM | POA: Diagnosis not present

## 2018-03-11 DIAGNOSIS — B1920 Unspecified viral hepatitis C without hepatic coma: Secondary | ICD-10-CM | POA: Diagnosis not present

## 2018-03-11 DIAGNOSIS — S22071D Stable burst fracture of T9-T10 vertebra, subsequent encounter for fracture with routine healing: Secondary | ICD-10-CM | POA: Diagnosis not present

## 2018-03-11 DIAGNOSIS — I429 Cardiomyopathy, unspecified: Secondary | ICD-10-CM | POA: Diagnosis not present

## 2018-03-12 DIAGNOSIS — I4891 Unspecified atrial fibrillation: Secondary | ICD-10-CM | POA: Diagnosis not present

## 2018-03-12 DIAGNOSIS — K7030 Alcoholic cirrhosis of liver without ascites: Secondary | ICD-10-CM | POA: Diagnosis not present

## 2018-03-12 DIAGNOSIS — W19XXXA Unspecified fall, initial encounter: Secondary | ICD-10-CM | POA: Diagnosis not present

## 2018-03-12 DIAGNOSIS — S22081A Stable burst fracture of T11-T12 vertebra, initial encounter for closed fracture: Secondary | ICD-10-CM | POA: Diagnosis not present

## 2018-03-12 DIAGNOSIS — E440 Moderate protein-calorie malnutrition: Secondary | ICD-10-CM | POA: Diagnosis not present

## 2018-03-12 DIAGNOSIS — M4624 Osteomyelitis of vertebra, thoracic region: Secondary | ICD-10-CM | POA: Diagnosis not present

## 2018-03-12 DIAGNOSIS — K766 Portal hypertension: Secondary | ICD-10-CM | POA: Diagnosis not present

## 2018-03-12 DIAGNOSIS — I5022 Chronic systolic (congestive) heart failure: Secondary | ICD-10-CM | POA: Diagnosis not present

## 2018-03-12 DIAGNOSIS — S22071A Stable burst fracture of T9-T10 vertebra, initial encounter for closed fracture: Secondary | ICD-10-CM | POA: Diagnosis not present

## 2018-03-12 DIAGNOSIS — Z681 Body mass index (BMI) 19 or less, adult: Secondary | ICD-10-CM | POA: Diagnosis not present

## 2018-03-12 DIAGNOSIS — B1920 Unspecified viral hepatitis C without hepatic coma: Secondary | ICD-10-CM | POA: Diagnosis not present

## 2018-03-12 DIAGNOSIS — R7881 Bacteremia: Secondary | ICD-10-CM | POA: Diagnosis not present

## 2018-03-13 DIAGNOSIS — I5022 Chronic systolic (congestive) heart failure: Secondary | ICD-10-CM | POA: Diagnosis not present

## 2018-03-13 DIAGNOSIS — S22081A Stable burst fracture of T11-T12 vertebra, initial encounter for closed fracture: Secondary | ICD-10-CM | POA: Diagnosis not present

## 2018-03-13 DIAGNOSIS — R7881 Bacteremia: Secondary | ICD-10-CM | POA: Diagnosis not present

## 2018-03-13 DIAGNOSIS — I4891 Unspecified atrial fibrillation: Secondary | ICD-10-CM | POA: Diagnosis not present

## 2018-03-13 DIAGNOSIS — W19XXXA Unspecified fall, initial encounter: Secondary | ICD-10-CM | POA: Diagnosis not present

## 2018-03-13 DIAGNOSIS — K7030 Alcoholic cirrhosis of liver without ascites: Secondary | ICD-10-CM | POA: Diagnosis not present

## 2018-03-13 DIAGNOSIS — K766 Portal hypertension: Secondary | ICD-10-CM | POA: Diagnosis not present

## 2018-03-13 DIAGNOSIS — M4624 Osteomyelitis of vertebra, thoracic region: Secondary | ICD-10-CM | POA: Diagnosis not present

## 2018-03-13 DIAGNOSIS — S22071A Stable burst fracture of T9-T10 vertebra, initial encounter for closed fracture: Secondary | ICD-10-CM | POA: Diagnosis not present

## 2018-03-13 DIAGNOSIS — E440 Moderate protein-calorie malnutrition: Secondary | ICD-10-CM | POA: Diagnosis not present

## 2018-03-13 DIAGNOSIS — B1920 Unspecified viral hepatitis C without hepatic coma: Secondary | ICD-10-CM | POA: Diagnosis not present

## 2018-03-13 DIAGNOSIS — Z681 Body mass index (BMI) 19 or less, adult: Secondary | ICD-10-CM | POA: Diagnosis not present

## 2018-03-14 DIAGNOSIS — I428 Other cardiomyopathies: Secondary | ICD-10-CM | POA: Diagnosis not present

## 2018-03-14 DIAGNOSIS — E7800 Pure hypercholesterolemia, unspecified: Secondary | ICD-10-CM | POA: Diagnosis not present

## 2018-03-14 DIAGNOSIS — I480 Paroxysmal atrial fibrillation: Secondary | ICD-10-CM | POA: Diagnosis not present

## 2018-03-14 DIAGNOSIS — K7460 Unspecified cirrhosis of liver: Secondary | ICD-10-CM | POA: Diagnosis not present

## 2018-03-14 DIAGNOSIS — E039 Hypothyroidism, unspecified: Secondary | ICD-10-CM | POA: Diagnosis not present

## 2018-03-14 DIAGNOSIS — I4891 Unspecified atrial fibrillation: Secondary | ICD-10-CM | POA: Diagnosis not present

## 2018-03-14 DIAGNOSIS — D62 Acute posthemorrhagic anemia: Secondary | ICD-10-CM | POA: Diagnosis not present

## 2018-03-14 DIAGNOSIS — Z743 Need for continuous supervision: Secondary | ICD-10-CM | POA: Diagnosis not present

## 2018-03-14 DIAGNOSIS — B1920 Unspecified viral hepatitis C without hepatic coma: Secondary | ICD-10-CM | POA: Diagnosis not present

## 2018-03-14 DIAGNOSIS — D61818 Other pancytopenia: Secondary | ICD-10-CM | POA: Diagnosis not present

## 2018-03-14 DIAGNOSIS — I429 Cardiomyopathy, unspecified: Secondary | ICD-10-CM | POA: Diagnosis not present

## 2018-03-14 DIAGNOSIS — S22082D Unstable burst fracture of T11-T12 vertebra, subsequent encounter for fracture with routine healing: Secondary | ICD-10-CM | POA: Diagnosis not present

## 2018-03-14 DIAGNOSIS — E559 Vitamin D deficiency, unspecified: Secondary | ICD-10-CM | POA: Diagnosis not present

## 2018-03-14 DIAGNOSIS — Z79899 Other long term (current) drug therapy: Secondary | ICD-10-CM | POA: Diagnosis not present

## 2018-03-14 DIAGNOSIS — E46 Unspecified protein-calorie malnutrition: Secondary | ICD-10-CM | POA: Diagnosis not present

## 2018-03-14 DIAGNOSIS — M21372 Foot drop, left foot: Secondary | ICD-10-CM | POA: Diagnosis not present

## 2018-03-14 DIAGNOSIS — E871 Hypo-osmolality and hyponatremia: Secondary | ICD-10-CM | POA: Diagnosis not present

## 2018-03-14 DIAGNOSIS — R5381 Other malaise: Secondary | ICD-10-CM | POA: Diagnosis not present

## 2018-03-14 DIAGNOSIS — D649 Anemia, unspecified: Secondary | ICD-10-CM | POA: Diagnosis not present

## 2018-03-14 DIAGNOSIS — M4325 Fusion of spine, thoracolumbar region: Secondary | ICD-10-CM | POA: Diagnosis not present

## 2018-03-14 DIAGNOSIS — R279 Unspecified lack of coordination: Secondary | ICD-10-CM | POA: Diagnosis not present

## 2018-03-14 DIAGNOSIS — E782 Mixed hyperlipidemia: Secondary | ICD-10-CM | POA: Diagnosis not present

## 2018-03-14 DIAGNOSIS — B9689 Other specified bacterial agents as the cause of diseases classified elsewhere: Secondary | ICD-10-CM | POA: Diagnosis not present

## 2018-03-14 DIAGNOSIS — G8929 Other chronic pain: Secondary | ICD-10-CM | POA: Diagnosis not present

## 2018-03-14 DIAGNOSIS — M4624 Osteomyelitis of vertebra, thoracic region: Secondary | ICD-10-CM | POA: Diagnosis not present

## 2018-03-14 DIAGNOSIS — E119 Type 2 diabetes mellitus without complications: Secondary | ICD-10-CM | POA: Diagnosis not present

## 2018-03-14 DIAGNOSIS — Z7409 Other reduced mobility: Secondary | ICD-10-CM | POA: Diagnosis not present

## 2018-03-14 DIAGNOSIS — D518 Other vitamin B12 deficiency anemias: Secondary | ICD-10-CM | POA: Diagnosis not present

## 2018-03-14 DIAGNOSIS — S22080D Wedge compression fracture of T11-T12 vertebra, subsequent encounter for fracture with routine healing: Secondary | ICD-10-CM | POA: Diagnosis not present

## 2018-03-15 DIAGNOSIS — G8929 Other chronic pain: Secondary | ICD-10-CM | POA: Diagnosis not present

## 2018-03-15 DIAGNOSIS — I480 Paroxysmal atrial fibrillation: Secondary | ICD-10-CM | POA: Diagnosis not present

## 2018-03-15 DIAGNOSIS — K7460 Unspecified cirrhosis of liver: Secondary | ICD-10-CM | POA: Diagnosis not present

## 2018-12-23 ENCOUNTER — Other Ambulatory Visit: Payer: Self-pay | Admitting: Family

## 2018-12-23 ENCOUNTER — Other Ambulatory Visit (HOSPITAL_COMMUNITY): Payer: Self-pay | Admitting: Family

## 2018-12-23 DIAGNOSIS — K7460 Unspecified cirrhosis of liver: Secondary | ICD-10-CM

## 2018-12-23 DIAGNOSIS — R188 Other ascites: Secondary | ICD-10-CM

## 2018-12-28 ENCOUNTER — Encounter: Payer: Self-pay | Admitting: Gastroenterology

## 2019-01-17 ENCOUNTER — Ambulatory Visit: Payer: Medicare Other | Admitting: Gastroenterology

## 2019-02-06 ENCOUNTER — Encounter: Payer: Self-pay | Admitting: Gastroenterology

## 2019-02-06 ENCOUNTER — Telehealth: Payer: Self-pay | Admitting: Gastroenterology

## 2019-02-06 ENCOUNTER — Ambulatory Visit: Payer: Medicare Other | Admitting: Gastroenterology

## 2019-02-06 NOTE — Telephone Encounter (Signed)
Patient was a no show and letter sent  °

## 2019-03-31 DEATH — deceased
